# Patient Record
Sex: Female | Born: 1950 | Race: Black or African American | Hispanic: No | Smoking: Former smoker
Health system: Southern US, Community
[De-identification: ages and names within clinical notes are randomized; demographics above are authoritative.]

## PROBLEM LIST (undated history)

## (undated) DIAGNOSIS — E119 Type 2 diabetes mellitus without complications: Secondary | ICD-10-CM

## (undated) DIAGNOSIS — I10 Essential (primary) hypertension: Secondary | ICD-10-CM

## (undated) DIAGNOSIS — K219 Gastro-esophageal reflux disease without esophagitis: Secondary | ICD-10-CM

## (undated) DIAGNOSIS — E785 Hyperlipidemia, unspecified: Secondary | ICD-10-CM

## (undated) DIAGNOSIS — N184 Chronic kidney disease, stage 4 (severe): Secondary | ICD-10-CM

---

## 2016-06-09 DIAGNOSIS — E119 Type 2 diabetes mellitus without complications: Secondary | ICD-10-CM | POA: Diagnosis not present

## 2016-08-18 DIAGNOSIS — N390 Urinary tract infection, site not specified: Secondary | ICD-10-CM | POA: Diagnosis not present

## 2016-08-18 DIAGNOSIS — N183 Chronic kidney disease, stage 3 (moderate): Secondary | ICD-10-CM | POA: Diagnosis not present

## 2016-08-18 DIAGNOSIS — R809 Proteinuria, unspecified: Secondary | ICD-10-CM | POA: Diagnosis not present

## 2016-08-18 DIAGNOSIS — I10 Essential (primary) hypertension: Secondary | ICD-10-CM | POA: Diagnosis not present

## 2016-08-25 ENCOUNTER — Other Ambulatory Visit: Payer: Self-pay | Admitting: Nephrology

## 2016-08-25 DIAGNOSIS — N183 Chronic kidney disease, stage 3 unspecified: Secondary | ICD-10-CM

## 2016-09-01 ENCOUNTER — Ambulatory Visit
Admission: RE | Admit: 2016-09-01 | Discharge: 2016-09-01 | Disposition: A | Discharge status: Home / Self Care | Payer: Medicare HMO | Source: Ambulatory Visit | Attending: Nephrology | Admitting: Nephrology

## 2016-09-01 DIAGNOSIS — I10 Essential (primary) hypertension: Secondary | ICD-10-CM | POA: Diagnosis not present

## 2016-09-01 DIAGNOSIS — N183 Chronic kidney disease, stage 3 unspecified: Secondary | ICD-10-CM

## 2016-09-01 DIAGNOSIS — N390 Urinary tract infection, site not specified: Secondary | ICD-10-CM | POA: Diagnosis not present

## 2016-09-01 DIAGNOSIS — R809 Proteinuria, unspecified: Secondary | ICD-10-CM | POA: Diagnosis not present

## 2016-09-09 DIAGNOSIS — E119 Type 2 diabetes mellitus without complications: Secondary | ICD-10-CM | POA: Diagnosis not present

## 2016-09-12 DIAGNOSIS — I10 Essential (primary) hypertension: Secondary | ICD-10-CM | POA: Diagnosis not present

## 2016-09-12 DIAGNOSIS — R809 Proteinuria, unspecified: Secondary | ICD-10-CM | POA: Diagnosis not present

## 2016-09-12 DIAGNOSIS — N184 Chronic kidney disease, stage 4 (severe): Secondary | ICD-10-CM | POA: Diagnosis not present

## 2016-09-21 DIAGNOSIS — I10 Essential (primary) hypertension: Secondary | ICD-10-CM | POA: Diagnosis not present

## 2016-09-21 DIAGNOSIS — K219 Gastro-esophageal reflux disease without esophagitis: Secondary | ICD-10-CM | POA: Diagnosis not present

## 2016-09-21 DIAGNOSIS — E119 Type 2 diabetes mellitus without complications: Secondary | ICD-10-CM | POA: Diagnosis not present

## 2016-10-18 DIAGNOSIS — K2940 Chronic atrophic gastritis without bleeding: Secondary | ICD-10-CM | POA: Diagnosis not present

## 2016-10-18 DIAGNOSIS — R1013 Epigastric pain: Secondary | ICD-10-CM | POA: Diagnosis not present

## 2016-10-18 DIAGNOSIS — R110 Nausea: Secondary | ICD-10-CM | POA: Diagnosis not present

## 2016-10-18 DIAGNOSIS — R1110 Vomiting, unspecified: Secondary | ICD-10-CM | POA: Diagnosis not present

## 2016-10-18 DIAGNOSIS — K3189 Other diseases of stomach and duodenum: Secondary | ICD-10-CM | POA: Diagnosis not present

## 2016-11-09 DIAGNOSIS — E1122 Type 2 diabetes mellitus with diabetic chronic kidney disease: Secondary | ICD-10-CM | POA: Diagnosis not present

## 2016-11-09 DIAGNOSIS — I10 Essential (primary) hypertension: Secondary | ICD-10-CM | POA: Diagnosis not present

## 2016-11-09 DIAGNOSIS — N184 Chronic kidney disease, stage 4 (severe): Secondary | ICD-10-CM | POA: Diagnosis not present

## 2016-11-09 DIAGNOSIS — E1129 Type 2 diabetes mellitus with other diabetic kidney complication: Secondary | ICD-10-CM | POA: Diagnosis not present

## 2016-11-09 DIAGNOSIS — R809 Proteinuria, unspecified: Secondary | ICD-10-CM | POA: Diagnosis not present

## 2016-11-15 ENCOUNTER — Ambulatory Visit
Admission: RE | Admit: 2016-11-15 | Discharge: 2016-11-15 | Disposition: A | Discharge status: Home / Self Care | Payer: Medicare HMO | Source: Ambulatory Visit | Attending: Nephrology | Admitting: Nephrology

## 2016-11-15 DIAGNOSIS — R809 Proteinuria, unspecified: Secondary | ICD-10-CM | POA: Diagnosis not present

## 2016-11-15 DIAGNOSIS — I129 Hypertensive chronic kidney disease with stage 1 through stage 4 chronic kidney disease, or unspecified chronic kidney disease: Secondary | ICD-10-CM | POA: Diagnosis present

## 2016-11-15 DIAGNOSIS — N184 Chronic kidney disease, stage 4 (severe): Secondary | ICD-10-CM | POA: Diagnosis not present

## 2016-11-15 LAB — BASIC METABOLIC PANEL
ANION GAP: 7 (ref 5–15)
BUN: 62 mg/dL — AB (ref 6–20)
CALCIUM: 8.4 mg/dL — AB (ref 8.9–10.3)
CO2: 22 mmol/L (ref 22–32)
CREATININE: 4.63 mg/dL — AB (ref 0.44–1.00)
Chloride: 109 mmol/L (ref 101–111)
GFR calc Af Amer: 11 mL/min — ABNORMAL LOW (ref >60)
GFR, EST NON AFRICAN AMERICAN: 9 mL/min — AB (ref >60)
GLUCOSE: 238 mg/dL — AB (ref 65–99)
Potassium: 5.9 mmol/L — ABNORMAL HIGH (ref 3.5–5.1)
Sodium: 138 mmol/L (ref 135–145)

## 2016-11-15 MED ORDER — SODIUM CHLORIDE 0.9 % IV SOLN
INTRAVENOUS | Status: DC
  Administered 2016-11-15: 12:00:00 via INTRAVENOUS

## 2016-11-15 NOTE — Progress Notes (Signed)
Dr Thedore MinsSingh beeped regarding lab results

## 2016-11-15 NOTE — Progress Notes (Signed)
Pt left unit in stable condition.

## 2016-11-15 NOTE — Progress Notes (Signed)
Lab reviewed by Dr Wynelle Linkkolluru- pt may be discharged to home -office will follow up with patient

## 2016-11-15 NOTE — Progress Notes (Signed)
Dr Thedore MinsSingh beeped again re: lab results

## 2016-11-15 NOTE — Progress Notes (Signed)
Abnormal Met B results faxed to offfice to [facility]. Mandy notified of abnormal results

## 2016-11-21 ENCOUNTER — Inpatient Hospital Stay
Admission: AD | Admit: 2016-11-21 | Discharge: 2016-12-01 | DRG: 674 | Disposition: A | Discharge status: Home / Self Care | Payer: Medicare HMO | Source: Ambulatory Visit | Attending: Internal Medicine | Admitting: Internal Medicine

## 2016-11-21 ENCOUNTER — Encounter: Payer: Self-pay | Admitting: *Deleted

## 2016-11-21 DIAGNOSIS — E875 Hyperkalemia: Secondary | ICD-10-CM | POA: Diagnosis present

## 2016-11-21 DIAGNOSIS — K219 Gastro-esophageal reflux disease without esophagitis: Secondary | ICD-10-CM | POA: Diagnosis present

## 2016-11-21 DIAGNOSIS — R110 Nausea: Secondary | ICD-10-CM | POA: Diagnosis not present

## 2016-11-21 DIAGNOSIS — E440 Moderate protein-calorie malnutrition: Secondary | ICD-10-CM | POA: Diagnosis present

## 2016-11-21 DIAGNOSIS — N185 Chronic kidney disease, stage 5: Secondary | ICD-10-CM | POA: Diagnosis not present

## 2016-11-21 DIAGNOSIS — Z886 Allergy status to analgesic agent status: Secondary | ICD-10-CM

## 2016-11-21 DIAGNOSIS — J449 Chronic obstructive pulmonary disease, unspecified: Secondary | ICD-10-CM | POA: Diagnosis present

## 2016-11-21 DIAGNOSIS — E785 Hyperlipidemia, unspecified: Secondary | ICD-10-CM | POA: Diagnosis not present

## 2016-11-21 DIAGNOSIS — E1122 Type 2 diabetes mellitus with diabetic chronic kidney disease: Secondary | ICD-10-CM | POA: Diagnosis present

## 2016-11-21 DIAGNOSIS — N186 End stage renal disease: Secondary | ICD-10-CM

## 2016-11-21 DIAGNOSIS — N2581 Secondary hyperparathyroidism of renal origin: Secondary | ICD-10-CM | POA: Diagnosis not present

## 2016-11-21 DIAGNOSIS — Z87891 Personal history of nicotine dependence: Secondary | ICD-10-CM | POA: Diagnosis not present

## 2016-11-21 DIAGNOSIS — I1311 Hypertensive heart and chronic kidney disease without heart failure, with stage 5 chronic kidney disease, or end stage renal disease: Secondary | ICD-10-CM | POA: Diagnosis present

## 2016-11-21 DIAGNOSIS — Z833 Family history of diabetes mellitus: Secondary | ICD-10-CM

## 2016-11-21 DIAGNOSIS — D631 Anemia in chronic kidney disease: Secondary | ICD-10-CM | POA: Diagnosis not present

## 2016-11-21 DIAGNOSIS — R112 Nausea with vomiting, unspecified: Secondary | ICD-10-CM | POA: Diagnosis not present

## 2016-11-21 DIAGNOSIS — N179 Acute kidney failure, unspecified: Secondary | ICD-10-CM | POA: Diagnosis not present

## 2016-11-21 DIAGNOSIS — E119 Type 2 diabetes mellitus without complications: Secondary | ICD-10-CM | POA: Diagnosis not present

## 2016-11-21 DIAGNOSIS — E1121 Type 2 diabetes mellitus with diabetic nephropathy: Secondary | ICD-10-CM | POA: Diagnosis present

## 2016-11-21 DIAGNOSIS — N049 Nephrotic syndrome with unspecified morphologic changes: Secondary | ICD-10-CM | POA: Diagnosis present

## 2016-11-21 DIAGNOSIS — N189 Chronic kidney disease, unspecified: Secondary | ICD-10-CM | POA: Diagnosis not present

## 2016-11-21 DIAGNOSIS — Z681 Body mass index (BMI) 19 or less, adult: Secondary | ICD-10-CM

## 2016-11-21 DIAGNOSIS — R809 Proteinuria, unspecified: Secondary | ICD-10-CM | POA: Diagnosis not present

## 2016-11-21 DIAGNOSIS — Z01818 Encounter for other preprocedural examination: Secondary | ICD-10-CM | POA: Diagnosis not present

## 2016-11-21 DIAGNOSIS — I10 Essential (primary) hypertension: Secondary | ICD-10-CM | POA: Diagnosis not present

## 2016-11-21 HISTORY — DX: Hyperlipidemia, unspecified: E78.5

## 2016-11-21 HISTORY — DX: Type 2 diabetes mellitus without complications: E11.9

## 2016-11-21 HISTORY — DX: Essential (primary) hypertension: I10

## 2016-11-21 HISTORY — DX: Gastro-esophageal reflux disease without esophagitis: K21.9

## 2016-11-21 HISTORY — DX: Chronic kidney disease, stage 4 (severe): N18.4

## 2016-11-21 LAB — GLUCOSE, CAPILLARY
GLUCOSE-CAPILLARY: 108 mg/dL — AB (ref 65–99)
Glucose-Capillary: 87 mg/dL (ref 65–99)

## 2016-11-21 MED ORDER — ATORVASTATIN CALCIUM 10 MG PO TABS
10.0000 mg | ORAL_TABLET | Freq: Every day | ORAL | Status: DC
  Administered 2016-11-22 – 2016-12-01 (×9): 10 mg via ORAL
  Filled 2016-11-21 (×10): qty 1

## 2016-11-21 MED ORDER — CEFAZOLIN SODIUM-DEXTROSE 1-4 GM/50ML-% IV SOLN
1.0000 g | INTRAVENOUS | Status: AC
  Administered 2016-11-22: 1 g via INTRAVENOUS
  Filled 2016-11-21 (×2): qty 50

## 2016-11-21 MED ORDER — NIFEDIPINE ER 30 MG PO TB24
30.0000 mg | ORAL_TABLET | Freq: Every day | ORAL | Status: DC
  Administered 2016-11-22 – 2016-11-23 (×2): 30 mg via ORAL
  Filled 2016-11-21 (×4): qty 1

## 2016-11-21 MED ORDER — ONDANSETRON HCL 4 MG/2ML IJ SOLN
4.0000 mg | Freq: Four times a day (QID) | INTRAMUSCULAR | Status: DC | PRN
  Filled 2016-11-21: qty 2

## 2016-11-21 MED ORDER — TUBERCULIN PPD 5 UNIT/0.1ML ID SOLN
5.0000 [IU] | Freq: Once | INTRADERMAL | Status: AC
  Administered 2016-11-21: 5 [IU] via INTRADERMAL
  Filled 2016-11-21: qty 0.1

## 2016-11-21 MED ORDER — SODIUM CHLORIDE 0.9 % IV SOLN
INTRAVENOUS | Status: DC
  Administered 2016-11-21 – 2016-11-22 (×3): via INTRAVENOUS

## 2016-11-21 MED ORDER — METOPROLOL TARTRATE 50 MG PO TABS
50.0000 mg | ORAL_TABLET | Freq: Two times a day (BID) | ORAL | Status: DC
  Administered 2016-11-21 – 2016-11-22 (×3): 50 mg via ORAL
  Filled 2016-11-21 (×3): qty 1

## 2016-11-21 MED ORDER — ACETAMINOPHEN 325 MG PO TABS
650.0000 mg | ORAL_TABLET | Freq: Four times a day (QID) | ORAL | Status: DC | PRN
  Administered 2016-11-23 – 2016-11-25 (×2): 650 mg via ORAL
  Filled 2016-11-21: qty 2

## 2016-11-21 MED ORDER — INSULIN ASPART 100 UNIT/ML ~~LOC~~ SOLN
0.0000 [IU] | Freq: Three times a day (TID) | SUBCUTANEOUS | Status: DC
  Administered 2016-11-23: 5 [IU] via SUBCUTANEOUS
  Administered 2016-11-24: 3 [IU] via SUBCUTANEOUS
  Administered 2016-11-24: 2 [IU] via SUBCUTANEOUS
  Administered 2016-11-24: 5 [IU] via SUBCUTANEOUS
  Administered 2016-11-25: 2 [IU] via SUBCUTANEOUS
  Administered 2016-11-25: 5 [IU] via SUBCUTANEOUS
  Administered 2016-11-26: 2 [IU] via SUBCUTANEOUS
  Administered 2016-11-26: 5 [IU] via SUBCUTANEOUS
  Administered 2016-11-26 – 2016-11-27 (×4): 2 [IU] via SUBCUTANEOUS
  Administered 2016-11-28: 3 [IU] via SUBCUTANEOUS
  Administered 2016-11-28: 1 [IU] via SUBCUTANEOUS
  Administered 2016-11-29: 3 [IU] via SUBCUTANEOUS
  Administered 2016-11-29 (×2): 2 [IU] via SUBCUTANEOUS
  Administered 2016-11-30: 3 [IU] via SUBCUTANEOUS
  Administered 2016-11-30: 1 [IU] via SUBCUTANEOUS
  Filled 2016-11-21 (×19): qty 1

## 2016-11-21 MED ORDER — PANTOPRAZOLE SODIUM 40 MG PO TBEC
40.0000 mg | DELAYED_RELEASE_TABLET | Freq: Two times a day (BID) | ORAL | Status: DC
  Administered 2016-11-21 – 2016-12-01 (×19): 40 mg via ORAL
  Filled 2016-11-21 (×19): qty 1

## 2016-11-21 MED ORDER — INSULIN ASPART 100 UNIT/ML ~~LOC~~ SOLN
0.0000 [IU] | Freq: Every day | SUBCUTANEOUS | Status: DC
Start: 2016-11-21 — End: 2016-12-01
  Administered 2016-11-25: 4 [IU] via SUBCUTANEOUS
  Administered 2016-11-26 – 2016-11-30 (×2): 2 [IU] via SUBCUTANEOUS
  Filled 2016-11-21 (×3): qty 1

## 2016-11-21 MED ORDER — ACETAMINOPHEN 650 MG RE SUPP: 650.0000 mg | Freq: Four times a day (QID) | RECTAL | Status: DC | PRN

## 2016-11-21 MED ORDER — HEPARIN SODIUM (PORCINE) 5000 UNIT/ML IJ SOLN
5000.0000 [IU] | Freq: Three times a day (TID) | INTRAMUSCULAR | Status: DC
  Administered 2016-11-21 – 2016-11-30 (×23): 5000 [IU] via SUBCUTANEOUS
  Filled 2016-11-21 (×24): qty 1

## 2016-11-21 MED ORDER — ONDANSETRON HCL 4 MG PO TABS
4.0000 mg | ORAL_TABLET | Freq: Four times a day (QID) | ORAL | Status: DC | PRN
  Filled 2016-11-21: qty 1

## 2016-11-21 NOTE — H&P (Signed)
[employer] - [place] at [facility]   PATIENT NAME: Brittany Bowman    MR#:  [id number]  DATE OF BIRTH:  1950/05/07  DATE OF ADMISSION:  11/21/2016  PRIMARY CARE PHYSICIAN: Velia Meyer, MD   REQUESTING/REFERRING PHYSICIAN: Dr. Mosetta Pigeon  CHIEF COMPLAINT:  No chief complaint on file. Acute on Chronic Renal failure.   HISTORY OF PRESENT ILLNESS:  Brittany Bowman  is a 66 y.o. female with a known history of diabetes, hypertension, GERD, hyperlipidemia, chronic kidney disease stage IV who presents to the hospital due to worsening renal failure and function. Patient was in her usual state of health and was having routine blood work done at his outpatient nephrologist office and her creatinine was noted to be significantly worse from before and therefore was sent to the ER for direct admission. Nephrology was planning on doing a kidney biopsy as an outpatient but her renal function has now gotten worse she may require hemodialysis and therefore was referred to the hospital for direct admission.Patient denies any shortness of breath, chest pain, nausea, vomiting, abdominal pain, fever or chills cough or any other associated symptoms presently.  PAST MEDICAL HISTORY:   Past Medical History:  Diagnosis Date  . CKD (chronic kidney disease), stage IV (HCC)   . Diabetes (HCC)   . Essential hypertension   . GERD (gastroesophageal reflux disease)   . Hyperlipidemia     PAST SURGICAL HISTORY:  No past surgical history on file.  SOCIAL HISTORY:   Social History  Substance Use Topics  . Smoking status: Former Smoker    Packs/day: 0.25    Years: 30.00  . Smokeless tobacco: Never Used  . Alcohol use No    FAMILY HISTORY:   Family History  Problem Relation Age of Onset  . Diabetes Mother     DRUG ALLERGIES:   Allergies  Allergen Reactions  . Aspirin   . Nsaids     REVIEW OF SYSTEMS:   Review of Systems  Constitutional: Negative for fever and  weight loss.  HENT: Negative for congestion, nosebleeds and tinnitus.   Eyes: Negative for blurred vision, double vision and redness.  Respiratory: Negative for cough, hemoptysis and shortness of breath.   Cardiovascular: Negative for chest pain, orthopnea, leg swelling and PND.  Gastrointestinal: Negative for abdominal pain, diarrhea, melena, nausea and vomiting.  Genitourinary: Negative for dysuria, hematuria and urgency.  Musculoskeletal: Negative for falls and joint pain.  Neurological: Negative for dizziness, tingling, sensory change, focal weakness, seizures, weakness and headaches.  Endo/Heme/Allergies: Negative for polydipsia. Does not bruise/bleed easily.  Psychiatric/Behavioral: Negative for depression and memory loss. The patient is not nervous/anxious.     MEDICATIONS AT HOME:   Prior to Admission medications   Medication Sig Start Date End Date Taking? Authorizing Provider  atorvastatin (LIPITOR) 10 MG tablet Take 10 mg by mouth daily.   Yes [provider]  metoprolol tartrate (LOPRESSOR) 50 MG tablet Take 50 mg by mouth 2 (two) times daily.   Yes [provider]  NIFEdipine (PROCARDIA-XL/ADALAT CC) 30 MG 24 hr tablet Take 30 mg by mouth daily.   Yes [provider]  pantoprazole (PROTONIX) 40 MG tablet Take 40 mg by mouth 2 (two) times daily before a meal.   Yes [provider]      VITAL SIGNS:  Blood pressure (!) 150/61, pulse 73, temperature 98.2 F (36.8 C), temperature source Oral, resp. rate 12, SpO2 97 %.  PHYSICAL EXAMINATION:  Physical Exam  GENERAL:  66 y.o.-year-old patient lying in the bed in no acute distress.  EYES: Pupils equal, round, reactive to light and accommodation. No scleral icterus. Extraocular muscles intact.  HEENT: Head atraumatic, normocephalic. Oropharynx and nasopharynx clear. No oropharyngeal erythema, moist oral mucosa  NECK:  Supple, no jugular venous distention. No thyroid enlargement, no  tenderness.  LUNGS: Normal breath sounds bilaterally, no wheezing, rales, rhonchi. No use of accessory muscles of respiration.  CARDIOVASCULAR: S1, S2 RRR. No murmurs, rubs, gallops, clicks.  ABDOMEN: Soft, nontender, nondistended. Bowel sounds present. No organomegaly or mass.  EXTREMITIES: No pedal edema, cyanosis, or clubbing. + 2 pedal & radial pulses b/l.   NEUROLOGIC: Cranial nerves II through XII are intact. No focal Motor or sensory deficits appreciated b/l PSYCHIATRIC: The patient is alert and oriented x 3.  SKIN: No obvious rash, lesion, or ulcer.   LABORATORY PANEL:   CBC No results for input(s): WBC, HGB, HCT, PLT in the last 168 hours. ------------------------------------------------------------------------------------------------------------------  Chemistries   Recent Labs Lab 11/15/16 1349  NA 138  K 5.9*  CL 109  CO2 22  GLUCOSE 238*  BUN 62*  CREATININE 4.63*  CALCIUM 8.4*   ------------------------------------------------------------------------------------------------------------------  Cardiac Enzymes No results for input(s): TROPONINI in the last 168 hours. ------------------------------------------------------------------------------------------------------------------  RADIOLOGY:  No results found.   IMPRESSION AND PLAN:   66 year old female with past medical history of diabetes, hypertension, hyperlipidemia,chronic kidney disease stage III to 4 who presented to the hospital due to worsening renal failure.  1. Acute on chronic renal failure-patient presents to the hospital due to her creatinine progressively worsening over the past couple months. - We'll gently hydrate the patient with IV fluids, follow BUN and creatinine and urine output. Renal dose minutes, avoid nephrotoxins. - We'll get a nephrology consult, patient may need to be initiated on hemodialysis and therefore we'll also consult vascular surgery for dialysis access. - Further care as  per nephrology.  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 Essential HTN - cont. Home meds.   4. Hyperlipidemia - cont. Atorvastatin  5. GERD - cont. Protonix.     All the records are reviewed and case discussed with ED provider. Management plans discussed with the patient, family and they are in agreement.  CODE STATUS: Full code  TOTAL TIME TAKING CARE OF THIS PATIENT: 45 minutes.    Houston Siren M.D on 11/21/2016 at 3:00 PM  Between 7am to 6pm - Pager - [phone]  After 6pm go to [email or web address] - password [email or web address] [facility]  [place] [facility]  Office  [phone]  CC: Primary care physician; Velia Meyer, MD

## 2016-11-21 NOTE — Consult Note (Signed)
[facility] Vascular Consult Note  MRN : [id number]  Brittany Bowman is a 66 y.o. ([birth year]) female who presents with chief complaint of No chief complaint on file. Brittany Bowman  History of Present Illness: I am asked to evaluate the patient by Dr. Cherlynn Kaiser for dialysis access.  The patient is a 66 year old woman who is been followed by Dr. Thedore Mins for proteinuria and stage IV renal insufficiency. She was sent to the hospital after her routine laboratory data demonstrated a significant worsening of her kidney function. On evaluation by Dr. Thedore Mins he does feel that she will require initiation of hemodialysis.  The patient does attest to nausea and some vomiting as well as vague abdominal discomfort. She also notes frequent belching. She attributes this to a chronic stomach problems and that is not yet defined although she does follow with a restaurant neurologist. She states that it may be a bit worse lately. She also attests to fatigue.  She denies shortness of breath cough and hemoptysis or severe leg swelling.  Current Facility-Administered Medications  Medication Dose Route Frequency Provider Last Rate Last Dose  . 0.9 %  sodium chloride infusion   Intravenous Continuous Houston Siren, MD 75 mL/hr at 11/21/16 1606    . acetaminophen (TYLENOL) tablet 650 mg  650 mg Oral Q6H PRN Houston Siren, MD       Or  . acetaminophen (TYLENOL) suppository 650 mg  650 mg Rectal Q6H PRN Houston Siren, MD      . Melene Muller ON 11/22/2016] atorvastatin (LIPITOR) tablet 10 mg  10 mg Oral Daily Sainani, Rolly Pancake, MD      . heparin injection 5,000 Units  5,000 Units Subcutaneous Q8H Sainani, Rolly Pancake, MD      . insulin aspart (novoLOG) injection 0-5 Units  0-5 Units Subcutaneous QHS Sainani, Rolly Pancake, MD      . insulin aspart (novoLOG) injection 0-9 Units  0-9 Units Subcutaneous TID WC Sainani, Rolly Pancake, MD      . metoprolol tartrate (LOPRESSOR) tablet 50 mg  50 mg Oral BID Houston Siren, MD       . Melene Muller ON 11/22/2016] NIFEdipine (PROCARDIA-XL/ADALAT CC) 24 hr tablet 30 mg  30 mg Oral Daily Sainani, Vivek J, MD      . ondansetron (ZOFRAN) tablet 4 mg  4 mg Oral Q6H PRN Houston Siren, MD       Or  . ondansetron (ZOFRAN) injection 4 mg  4 mg Intravenous Q6H PRN Houston Siren, MD      . pantoprazole (PROTONIX) EC tablet 40 mg  40 mg Oral BID AC Houston Siren, MD   40 mg at 11/21/16 1708  . tuberculin injection 5 Units  5 Units Intradermal Once Mosetta Pigeon, MD   5 Units at 11/21/16 1709    Past Medical History:  Diagnosis Date  . CKD (chronic kidney disease), stage IV (HCC)   . Diabetes (HCC)   . Essential hypertension   . GERD (gastroesophageal reflux disease)   . Hyperlipidemia     History reviewed. No pertinent surgical history.  Social History Social History  Substance Use Topics  . Smoking status: Former Smoker    Packs/day: 0.25    Years: 30.00  . Smokeless tobacco: Never Used  . Alcohol use No    Family History Family History  Problem Relation Age of Onset  . Diabetes Mother   No family history of bleeding/clotting disorders, porphyria or autoimmune disease   Allergies  Allergen Reactions  . Aspirin   . Nsaids      REVIEW OF SYSTEMS (Negative unless checked)  Constitutional: [] Weight loss  [] Fever  [] Chills Cardiac: [] Chest pain   [] Chest pressure   [] Palpitations   [] Shortness of breath when laying flat   [] Shortness of breath at rest   [] Shortness of breath with exertion. Vascular:  [] Pain in legs with walking   [] Pain in legs at rest   [] Pain in legs when laying flat   [] Claudication   [] Pain in feet when walking  [] Pain in feet at rest  [] Pain in feet when laying flat   [] History of DVT   [] Phlebitis   [] Swelling in legs   [] Varicose veins   [] Non-healing ulcers Pulmonary:   [] Uses home oxygen   [] Productive cough   [] Hemoptysis   [] Wheeze  [] COPD   [] Asthma Neurologic:  [] Dizziness  [] Blackouts   [] Seizures   [] History of stroke    [] History of TIA  [] Aphasia   [] Temporary blindness   [] Dysphagia   [] Weakness or numbness in arms   [] Weakness or numbness in legs Musculoskeletal:  [] Arthritis   [] Joint swelling   [] Joint pain   [] Low back pain Hematologic:  [] Easy bruising  [] Easy bleeding   [] Hypercoagulable state   [x] Anemic  [] Hepatitis Gastrointestinal:  [] Blood in stool   [] Vomiting blood  [] Gastroesophageal reflux/heartburn   [] Difficulty swallowing. Genitourinary:  [x] Chronic kidney disease   [] Difficult urination  [] Frequent urination  [] Burning with urination   [] Blood in urine Skin:  [] Rashes   [] Ulcers   [] Wounds Psychological:  [] History of anxiety   []  History of major depression.  Physical Examination  Vitals:   11/21/16 1447  BP: (!) 150/61  Pulse: 73  Resp: 12  Temp: 98.2 F (36.8 C)  TempSrc: Oral  SpO2: 97%  Weight: 54 kg (119 lb 1.6 oz)  Height: 5\' 8"  (1.727 m)   Body mass index is 18.11 kg/m. Gen:  WD/WN, NAD Head: [place]/AT, No temporalis wasting. Prominent temp pulse not noted. Ear/Nose/Throat: Hearing grossly intact, nares w/o erythema or drainage, oropharynx w/o Erythema/Exudate Eyes: Sclera non-icteric, conjunctiva clear Neck: Trachea midline.  No JVD.  Pulmonary:  Good air movement, respirations not labored, equal bilaterally.  Cardiac: RRR, normal S1, S2. Vascular: Legs with 1-2+ edema Vessel Right Left  Radial Palpable Palpable  Ulnar Palpable Palpable  Brachial Palpable Palpable  Carotid Palpable, without bruit Palpable, without bruit  PT Palpable Palpable  DP Palpable Palpable  Gastrointestinal: soft, non-tender/non-distended. No guarding/reflex.  Musculoskeletal: M/S 5/5 throughout.  Extremities without ischemic changes.  No deformity or atrophy. No edema. Neurologic: Sensation grossly intact in extremities.  Symmetrical.  Speech is fluent. Motor exam as listed above. Psychiatric: Judgment intact, Mood & affect appropriate for pt's clinical situation. Dermatologic: No rashes  or ulcers noted.  No cellulitis or open wounds. Lymph : No Cervical, Axillary, or Inguinal lymphadenopathy.      CBC No results found for: WBC, HGB, HCT, MCV, PLT  BMET    Component Value Date/Time   NA 138 11/15/2016 1349   K 5.9 (H) 11/15/2016 1349   CL 109 11/15/2016 1349   CO2 22 11/15/2016 1349   GLUCOSE 238 (H) 11/15/2016 1349   BUN 62 (H) 11/15/2016 1349   CREATININE 4.63 (H) 11/15/2016 1349   CALCIUM 8.4 (L) 11/15/2016 1349   GFRNONAA 9 (L) 11/15/2016 1349   GFRAA 11 (L) 11/15/2016 1349   Estimated Creatinine Clearance: 10.3 mL/min (A) (by C-G formula based on SCr of 4.63  mg/dL (H)).  COAG No results found for: INR, PROTIME  Radiology No results found.    Assessment/Plan 1. Acute on chronic renal failure-patient presents to the hospital due to her creatinine progressively worsening over the past couple months. She is currently being hydrated with normal saline. At this point Dr. Thedore Mins feels that dialysis should be initiated however she is not an extremist she is not short of breath and therefore rather than place a temporary catheter I will plan for a permacatheter to be placed tomorrow. The risks and benefits of been reviewed all questions answered the patient agrees to proceed.  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 I will make her nothing by mouth after breakfast tomorrow  3. Essential HTN          Continue antihypertensive home meds.   4. Hyperlipidemia           Continue her statin  5. GERD            Continue her PPI    Levora Dredge, MD  11/21/2016 7:59 PM    This note was created with Dragon medical transcription system.  Any error is purely unintentional

## 2016-11-21 NOTE — Progress Notes (Signed)
[facility] [place], [place] 11/21/16  Subjective:   Patient known to our practice from outpatient. She is followed for proteinuria, hypertension and chronic kidney disease Over the past several months, she has had a progressive decline in her renal function. Serologies have been negative. Outpatient, she is having difficulty with hyperkalemia She is admitted to get started on dialysis  Objective:  Vital signs in last 24 hours:  Temp:  [98.2 F (36.8 C)] 98.2 F (36.8 C) (08/21 1447) Pulse Rate:  [73] 73 (08/21 1447) Resp:  [12] 12 (08/21 1447) BP: (150)/(61) 150/61 (08/21 1447) SpO2:  [97 %] 97 % (08/21 1447) Weight:  [54 kg (119 lb 1.6 oz)] 54 kg (119 lb 1.6 oz) (08/21 1447)  Weight change:  Filed Weights   11/21/16 1447  Weight: 54 kg (119 lb 1.6 oz)    Intake/Output:   No intake or output data in the 24 hours ending 11/21/16 1554   Physical Exam: General: Thin, no acute distress   HEENT Anicteric, moist oral mucous membranes   Neck Supple   Pulm/lungs Lungs are clear to auscultation   CVS/Heart Regular rhythm, no rub or gallop   Abdomen:  Soft, nontender   Extremities: No edema   Neurologic: Alert, oriented   Skin: No acute rashes   Access: To be placed        Basic Metabolic Panel:   Recent Labs Lab 11/15/16 1349  NA 138  K 5.9*  CL 109  CO2 22  GLUCOSE 238*  BUN 62*  CREATININE 4.63*  CALCIUM 8.4*     CBC: No results for input(s): WBC, NEUTROABS, HGB, HCT, MCV, PLT in the last 168 hours.   No results found for: HEPBSAG, HEPBSAB, HEPBIGM    Microbiology:  No results found for this or any previous visit (from the past 240 hour(s)).  Coagulation Studies: No results for input(s): LABPROT, INR in the last 72 hours.  Urinalysis: No results for input(s): COLORURINE, LABSPEC, PHURINE, GLUCOSEU, HGBUR, BILIRUBINUR, KETONESUR, PROTEINUR, UROBILINOGEN, NITRITE, LEUKOCYTESUR in the last 72 hours.  Invalid input(s):  APPERANCEUR    Imaging: No results found.   Medications:   . sodium chloride     . heparin  5,000 Units Subcutaneous Q8H  . insulin aspart  0-5 Units Subcutaneous QHS  . insulin aspart  0-9 Units Subcutaneous TID WC   acetaminophen **OR** acetaminophen, ondansetron **OR** ondansetron (ZOFRAN) IV  Assessment/ Plan:  66 y.o. female with long-standing diabetes, left ventricular hypertrophy, severe hypertension and chronic kidney disease. She was Dx with left ICA stenosis in October 2017. A 2-D echo at that time showed severe LVH and stage I diastolic dysfunction. LV EF was 65-70%. Her hemoglobin A1c was 6.1%.  1. Chronic kidney disease stage V. Patient has likely progressed to end-stage renal disease.  Chronic kidney disease is likely secondary to nephrotic syndrome. Differential includes diabetic kidney disease, FSGS, membranous nephropathy. We discussed that due to advanced renal failure and echogenic kidneys, there is a higher risk of bleeding. Patient has decided not to go for kidney biopsy. We discussed various forms of renal replacement therapy including peritoneal dialysis and hemodialysis. At present, patient prefers hemodialysis therefore we will ask vascular surgery to place a PermCath. We will obtain vein mapping for future access placement. We will start discharge planning for outpatient dialysis center.   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 Anemia of chronic kidney disease  - We will  obtain iron studies and get patient started on ESA therapy as indicated   4. Proteinuria - We will be able to start ARB once potassium is controlled.   5. Hyperkalemia - Dietary education - Expected to correct with hemodialysis   LOS: 0 [facility] 8/21/20183:54 PM  [facility] [place], [place] [phone]

## 2016-11-22 ENCOUNTER — Inpatient Hospital Stay
Admission: AD | Disposition: A | Discharge status: Home / Self Care | Payer: Self-pay | Source: Ambulatory Visit | Attending: Specialist

## 2016-11-22 DIAGNOSIS — N186 End stage renal disease: Secondary | ICD-10-CM

## 2016-11-22 HISTORY — PX: DIALYSIS/PERMA CATHETER INSERTION: CATH118288

## 2016-11-22 LAB — BASIC METABOLIC PANEL
Anion gap: 7 (ref 5–15)
BUN: 67 mg/dL — ABNORMAL HIGH (ref 6–20)
CHLORIDE: 111 mmol/L (ref 101–111)
CO2: 22 mmol/L (ref 22–32)
CREATININE: 4.32 mg/dL — AB (ref 0.44–1.00)
Calcium: 8.3 mg/dL — ABNORMAL LOW (ref 8.9–10.3)
GFR calc non Af Amer: 10 mL/min — ABNORMAL LOW (ref >60)
GFR, EST AFRICAN AMERICAN: 11 mL/min — AB (ref >60)
Glucose, Bld: 104 mg/dL — ABNORMAL HIGH (ref 65–99)
Potassium: 4.9 mmol/L (ref 3.5–5.1)
Sodium: 140 mmol/L (ref 135–145)

## 2016-11-22 LAB — CBC
HCT: 23.8 % — ABNORMAL LOW (ref 35.0–47.0)
Hemoglobin: 8.1 g/dL — ABNORMAL LOW (ref 12.0–16.0)
MCH: 31.6 pg (ref 26.0–34.0)
MCHC: 34.2 g/dL (ref 32.0–36.0)
MCV: 92.3 fL (ref 80.0–100.0)
PLATELETS: 294 10*3/uL (ref 150–440)
RBC: 2.58 MIL/uL — AB (ref 3.80–5.20)
RDW: 12.2 % (ref 11.5–14.5)
WBC: 7.6 10*3/uL (ref 3.6–11.0)

## 2016-11-22 LAB — GLUCOSE, CAPILLARY
GLUCOSE-CAPILLARY: 53 mg/dL — AB (ref 65–99)
GLUCOSE-CAPILLARY: 98 mg/dL (ref 65–99)
Glucose-Capillary: 85 mg/dL (ref 65–99)
Glucose-Capillary: 153 mg/dL — ABNORMAL HIGH (ref 65–99)
Glucose-Capillary: 109 mg/dL — ABNORMAL HIGH (ref 65–99)

## 2016-11-22 LAB — HIV ANTIBODY (ROUTINE TESTING W REFLEX): HIV SCREEN 4TH GENERATION: NONREACTIVE

## 2016-11-22 SURGERY — DIALYSIS/PERMA CATHETER INSERTION
Anesthesia: Moderate Sedation

## 2016-11-22 MED ORDER — LIDOCAINE-EPINEPHRINE (PF) 2 %-1:200000 IJ SOLN
INTRAMUSCULAR | Status: AC
  Filled 2016-11-22: qty 20

## 2016-11-22 MED ORDER — DEXTROSE 50 % IV SOLN
25.0000 mL | Freq: Once | INTRAVENOUS | Status: AC
  Administered 2016-11-22: 25 mL via INTRAVENOUS
  Filled 2016-11-22: qty 50

## 2016-11-22 MED ORDER — NEPRO/CARBSTEADY PO LIQD
237.0000 mL | Freq: Two times a day (BID) | ORAL | Status: DC
  Administered 2016-11-23 – 2016-11-30 (×13): 237 mL via ORAL

## 2016-11-22 MED ORDER — FENTANYL CITRATE (PF) 100 MCG/2ML IJ SOLN
INTRAMUSCULAR | Status: DC | PRN
  Administered 2016-11-22: 25 ug via INTRAVENOUS
  Administered 2016-11-22: 50 ug via INTRAVENOUS

## 2016-11-22 MED ORDER — HEPARIN (PORCINE) IN NACL 2-0.9 UNIT/ML-% IJ SOLN
INTRAMUSCULAR | Status: AC
  Filled 2016-11-22: qty 500

## 2016-11-22 MED ORDER — MIDAZOLAM HCL 5 MG/5ML IJ SOLN
INTRAMUSCULAR | Status: AC
  Filled 2016-11-22: qty 5

## 2016-11-22 MED ORDER — HEPARIN SODIUM (PORCINE) 10000 UNIT/ML IJ SOLN
INTRAMUSCULAR | Status: AC
  Filled 2016-11-22: qty 1

## 2016-11-22 MED ORDER — RENA-VITE PO TABS
1.0000 | ORAL_TABLET | Freq: Every day | ORAL | Status: DC
  Administered 2016-11-24 – 2016-11-30 (×7): 1 via ORAL
  Filled 2016-11-22 (×8): qty 1

## 2016-11-22 MED ORDER — HYDRALAZINE HCL 20 MG/ML IJ SOLN
10.0000 mg | INTRAMUSCULAR | Status: DC | PRN
  Administered 2016-11-22 – 2016-11-23 (×2): 10 mg via INTRAVENOUS
  Filled 2016-11-22 (×2): qty 1

## 2016-11-22 MED ORDER — IRBESARTAN 150 MG PO TABS
150.0000 mg | ORAL_TABLET | Freq: Every day | ORAL | Status: DC
  Administered 2016-11-22: 150 mg via ORAL
  Filled 2016-11-22: qty 1

## 2016-11-22 MED ORDER — CLONIDINE HCL 0.3 MG/24HR TD PTWK
0.3000 mg | MEDICATED_PATCH | TRANSDERMAL | Status: DC
  Administered 2016-11-22: 0.3 mg via TRANSDERMAL
  Filled 2016-11-22: qty 1

## 2016-11-22 MED ORDER — FENTANYL CITRATE (PF) 100 MCG/2ML IJ SOLN
INTRAMUSCULAR | Status: AC
  Filled 2016-11-22: qty 2

## 2016-11-22 MED ORDER — MIDAZOLAM HCL 2 MG/2ML IJ SOLN
INTRAMUSCULAR | Status: DC | PRN
  Administered 2016-11-22: 2 mg via INTRAVENOUS
  Administered 2016-11-22: 0.5 mg via INTRAVENOUS

## 2016-11-22 SURGICAL SUPPLY — 9 items
BIOPATCH RED 1 DISK 7.0 (GAUZE/BANDAGES/DRESSINGS) ×2 IMPLANT
CATH PALINDROME RT-P 15FX19CM (CATHETERS) ×2 IMPLANT
DRAPE INCISE IOBAN 66X45 STRL (DRAPES) ×2 IMPLANT
NEEDLE ENTRY 21GA 7CM ECHOTIP (NEEDLE) ×2 IMPLANT
PACK ANGIOGRAPHY (CUSTOM PROCEDURE TRAY) ×2 IMPLANT
SET INTRO CAPELLA COAXIAL (SET/KITS/TRAYS/PACK) ×2 IMPLANT
SUT MNCRL AB 4-0 PS2 18 (SUTURE) ×2 IMPLANT
SUT SILK 0 FSL (SUTURE) ×2 IMPLANT
TOWEL OR 17X26 4PK STRL BLUE (TOWEL DISPOSABLE) ×2 IMPLANT

## 2016-11-22 NOTE — Progress Notes (Signed)
Patient in recovery post procedure, vitals stable. Denies complaints, taking po's without difficulty. Called report to [facility] with plan reviewed. Sinus rhythm per monitor.

## 2016-11-22 NOTE — Progress Notes (Signed)
[facility] [place], [place] 11/22/16  Subjective:   Patient known to our practice from outpatient. She is followed for proteinuria, hypertension and chronic kidney disease Over the past several months, she has had a progressive decline in her renal function. Serologies have been negative. Feels well today Scheduled for permcath today   Objective:  Vital signs in last 24 hours:  Temp:  [98 F (36.7 C)-98.7 F (37.1 C)] 98.7 F (37.1 C) (08/22 0746) Pulse Rate:  [73-90] 90 (08/22 0746) Resp:  [12-20] 18 (08/22 0448) BP: (150-216)/(61-80) 179/76 (08/22 0746) SpO2:  [97 %-100 %] 99 % (08/22 0746) Weight:  [54 kg (119 lb 1.6 oz)] 54 kg (119 lb 1.6 oz) (08/21 1447)  Weight change:  Filed Weights   11/21/16 1447  Weight: 54 kg (119 lb 1.6 oz)    Intake/Output:    Intake/Output Summary (Last 24 hours) at 11/22/16 1022 Last data filed at 11/22/16 0933  Gross per 24 hour  Intake           1430.3 ml  Output              800 ml  Net            630.3 ml     Physical Exam: General: Thin, no acute distress   HEENT Anicteric, moist oral mucous membranes   Neck Supple   Pulm/lungs Lungs are clear to auscultation   CVS/Heart Regular rhythm, no rub or gallop   Abdomen:  Soft, nontender   Extremities: No edema   Neurologic: Alert, oriented   Skin: No acute rashes   Access: To be placed        Basic Metabolic Panel:   Recent Labs Lab 11/15/16 1349 11/22/16 0338  NA 138 140  K 5.9* 4.9  CL 109 111  CO2 22 22  GLUCOSE 238* 104*  BUN 62* 67*  CREATININE 4.63* 4.32*  CALCIUM 8.4* 8.3*     CBC:  Recent Labs Lab 11/22/16 0338  WBC 7.6  HGB 8.1*  HCT 23.8*  MCV 92.3  PLT 294     No results found for: HEPBSAG, HEPBSAB, HEPBIGM    Microbiology:  No results found for this or any previous visit (from the past 240 hour(s)).  Coagulation Studies: No results for input(s): LABPROT, INR in the last 72 hours.  Urinalysis: No results for  input(s): COLORURINE, LABSPEC, PHURINE, GLUCOSEU, HGBUR, BILIRUBINUR, KETONESUR, PROTEINUR, UROBILINOGEN, NITRITE, LEUKOCYTESUR in the last 72 hours.  Invalid input(s): APPERANCEUR    Imaging: No results found.   Medications:   .  ceFAZolin (ANCEF) IV     . atorvastatin  10 mg Oral Daily  . cloNIDine  0.3 mg Transdermal Weekly  . heparin  5,000 Units Subcutaneous Q8H  . insulin aspart  0-5 Units Subcutaneous QHS  . insulin aspart  0-9 Units Subcutaneous TID WC  . metoprolol tartrate  50 mg Oral BID  . NIFEdipine  30 mg Oral Daily  . pantoprazole  40 mg Oral BID AC  . tuberculin  5 Units Intradermal Once   acetaminophen **OR** acetaminophen, hydrALAZINE, ondansetron **OR** ondansetron (ZOFRAN) IV  Assessment/ Plan:  66 y.o. female with long-standing diabetes, left ventricular hypertrophy, severe hypertension and chronic kidney disease. She was Dx with left ICA stenosis in October 2017. A 2-D echo at that time showed severe LVH and stage I diastolic dysfunction. LV EF was 65-70%. Her hemoglobin A1c was 6.1%.  1. Chronic kidney disease stage V. Patient has likely progressed  to end-stage renal disease.  Chronic kidney disease is likely secondary to nephrotic syndrome. Differential includes diabetic kidney disease, FSGS, membranous nephropathy.  - start HD today after catheter is available  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 Anemia of chronic kidney disease  - We will obtain iron studies and get patient started on ESA therapy as indicated   4. Proteinuria - Start ARB  today  5. Hyperkalemia - Dietary education provided by dietician   LOS: 1 [facility] 8/22/201810:22 AM  [facility] [place], [place] [phone]

## 2016-11-22 NOTE — Op Note (Signed)
[facility]   OPERATIVE NOTE     PROCEDURE: 1. Insertion of a right IJ tunneled dialysis catheter. 2. Catheter placement and cannulation under ultrasound and fluoroscopic guidance  PRE-OPERATIVE DIAGNOSIS: end-stage renal requiring hemodialysis  POST-OPERATIVE DIAGNOSIS: same as above  SURGEON: Levora Dredge  ANESTHESIA: Conscious sedation was administered under my direct supervision by the interventional radiology RN. IV Versed plus fentanyl were utilized. Continuous ECG, pulse oximetry and blood pressure was monitored throughout the entire procedure.  Conscious sedation was for a total of 27 minutes.  ESTIMATED BLOOD LOSS: Minimal  FINDING(S): 1.  Tips of the catheter in the right atrium on fluoroscopy 2.  No obvious pneumothorax on fluoroscopy  SPECIMEN(S):  none  INDICATIONS:   Brittany Bowman is a 66 y.o. female  presents with end stage renal disease.  Therefore, the patient requires a tunneled dialysis catheter placement.  The patient is informed of  the risks catheter placement include but are not limited to: bleeding, infection, central venous injury, pneumothorax, possible venous stenosis, possible malpositioning in the venous system, and possible infections related to long-term catheter presence.  The patient was aware of these risks and agreed to proceed.  DESCRIPTION: The patient was taken back to Special Procedure suite.  Prior to sedation, the patient was given IV antibiotics.  After obtaining adequate sedation, the patient was prepped and draped in the standard fashion for a chest or neck tunneled dialysis catheter placement.  Appropriate Time Out is called.     The the right neck and chest wall are then infiltrated with 1% Lidocaine with epinepherine.  A 19 cm tip to cuff Palindrome catheter is then selected, opened on the back table and prepped. Ultrasound is placed in a sterile sleeve.  Under ultrasound guidance, the right IJ vein is  examined and is noted to be echolucent and easily compressible indicating patency.   An image is recorded for the permanent record.  The right IJ vein is cannulated with the microneedle under direct ultrasound vissualization.  A Microwire followed by a micro sheath is then inserted without difficulty.   A J-wire was then advanced under fluoroscopic guidance into the inferior vena cava and the wire was secured.  Small counter incision was then made at the wire insertion site. A small pocket was fashioned with blunt dissection to allow easier passage of the cuff.  The dilator and peel-away sheath are then advanced over the wire under fluoroscopic guidance. The catheters and advanced through the peel-away sheath. It is approximated to the chest wall after verifying the tips at the atrial caval junction and an exit site is selected.  Small incision is made at the selected exit site and the tunneling device was passed subcutaneously to the neck counter incision. Catheter is then pulled through the subcutaneous tunnel. The catheter is then verified for tip position under fluoroscopy, transected and the hub assembly connected.    Each port was tested by aspirating and flushing.  No resistance was noted.  Each port was then thoroughly flushed with heparinized saline.  The catheter was secured in placed with two interrupted stitches of 0 silk tied to the catheter.  The neck incision was closed with a U-stitch of 4-0 Monocryl.  The neck and chest incision were cleaned and sterile bandages applied including a Biopatch.  Each port was then packed with concentrated heparin (1000 Units/mL) at the manufacturer recommended volumes to each port.  Sterile caps were applied to each port.  On completion fluoroscopy, the  tips of the catheter were in the right atrium, and there was no evidence of pneumothorax.  COMPLICATIONS: None  CONDITION: Unchanged   Levora Dredge [facility] Office:  [phone]   11/22/2016, 5:06 PM

## 2016-11-22 NOTE — Progress Notes (Signed)
[facility] [place] at [facility]   PATIENT NAME: Brittany Bowman    MR#:  [id number]  DATE OF BIRTH:  Jun 09, 1950  SUBJECTIVE:  CHIEF COMPLAINT:  No chief complaint on file.  The patient is a 66 year old African-American female with past medical history significant for history of COPD, stage IV, diabetes, hypertension, gastroesophageal reflux disease, hypertension, lipidemia, who presented to the hospital with worsening renal failure. She was initiated on IV fluids, her creatinine improved somewhat, she had no chest pains, shortness of breath, however, complained of nausea. Her blood pressure was noted to be markedly elevated around 200 systolic  Review of Systems  Constitutional: Negative for chills, fever and weight loss.  HENT: Negative for congestion.   Eyes: Negative for blurred vision and double vision.  Respiratory: Negative for cough, sputum production, shortness of breath and wheezing.   Cardiovascular: Negative for chest pain, palpitations, orthopnea, leg swelling and PND.  Gastrointestinal: Positive for abdominal pain and nausea. Negative for blood in stool, constipation, diarrhea and vomiting.  Genitourinary: Negative for dysuria, frequency, hematuria and urgency.  Musculoskeletal: Negative for falls.  Neurological: Negative for dizziness, tremors, focal weakness and headaches.  Endo/Heme/Allergies: Does not bruise/bleed easily.  Psychiatric/Behavioral: Negative for depression. The patient does not have insomnia.     VITAL SIGNS: Blood pressure (!) 179/76, pulse 90, temperature 98.7 F (37.1 C), temperature source Oral, resp. rate 18, height 5\' 8"  (1.727 m), weight 54 kg (119 lb 1.6 oz), SpO2 99 %.  PHYSICAL EXAMINATION:   GENERAL:  66 y.o.-year-old patient lying in the bed in mild discomfort due to abdominal discomfort as well as nausea EYES: Pupils equal, round, reactive to light and accommodation. No scleral icterus. Extraocular muscles  intact.  HEENT: Head atraumatic, normocephalic. Oropharynx and nasopharynx clear.  NECK:  Supple, no jugular venous distention. No thyroid enlargement, no tenderness.  LUNGS: Normal breath sounds bilaterally, no wheezing, rales,rhonchi or crepitation. No use of accessory muscles of respiration.  CARDIOVASCULAR: S1, S2 normal. No murmurs, rubs, or gallops.  ABDOMEN: Soft, nontender, nondistended. Bowel sounds present. No organomegaly or mass.  EXTREMITIES: No pedal edema, cyanosis, or clubbing.  NEUROLOGIC: Cranial nerves II through XII are intact. Muscle strength 5/5 in all extremities. Sensation intact. Gait not checked.  PSYCHIATRIC: The patient is alert and oriented x 3.  SKIN: No obvious rash, lesion, or ulcer.   ORDERS/RESULTS REVIEWED:   CBC  Recent Labs Lab 11/22/16 0338  WBC 7.6  HGB 8.1*  HCT 23.8*  PLT 294  MCV 92.3  MCH 31.6  MCHC 34.2  RDW 12.2   ------------------------------------------------------------------------------------------------------------------  Chemistries   Recent Labs Lab 11/15/16 1349 11/22/16 0338  NA 138 140  K 5.9* 4.9  CL 109 111  CO2 22 22  GLUCOSE 238* 104*  BUN 62* 67*  CREATININE 4.63* 4.32*  CALCIUM 8.4* 8.3*   ------------------------------------------------------------------------------------------------------------------ estimated creatinine clearance is 11.1 mL/min (A) (by C-G formula based on SCr of 4.32 mg/dL (H)). ------------------------------------------------------------------------------------------------------------------ No results for input(s): TSH, T4TOTAL, T3FREE, THYROIDAB in the last 72 hours.  Invalid input(s): FREET3  Cardiac Enzymes No results for input(s): CKMB, TROPONINI, MYOGLOBIN in the last 168 hours.  Invalid input(s): CK ------------------------------------------------------------------------------------------------------------------ Invalid input(s):  POCBNP ---------------------------------------------------------------------------------------------------------------  RADIOLOGY: No results found.  EKG: No orders found for this or any previous visit.  ASSESSMENT AND PLAN:  Active Problems:   Acute on chronic renal failure (HCC)  #1. Acute on chronic renal failure, underlying CTD stage V, discussed with Dr. Thedore Mins, likely progress  to end-stage renal disease, patient will be receiving permacath today for possible dialysis soon, discontinue IV fluids #2. Malignant essential hypertension with worsening kidney function, discontinue IV fluids, initiate patient on: Didn't topically, continue metoprolol, Avapro, follow closely #3. Anemia of CK D, getting iron studies, patient will be started on Epogen therapy by nephrologist #4. Diabetes mellitus type 2, continue sliding scale insulin, blood glucose is ranging between 85-150, continue diabetic diet, will need to change to renal/diabetic diet with fluid restrictions #5. Nausea, suspected hypertensive encephalopathy versus uremia, follow with therapy, continue patient on a diet, antiemetics  Management plans discussed with the patient, family and they are in agreement.   DRUG ALLERGIES:  Allergies  Allergen Reactions  . Aspirin   . Nsaids     CODE STATUS:     Code Status Orders        Start     Ordered   11/21/16 1446  Full code  Continuous     11/21/16 1446    Code Status History    Date Active Date Inactive Code Status Order ID Comments User Context   This patient has a current code status but no historical code status.      TOTAL TIME TAKING CARE OF THIS PATIENT: 40 minutes.    Katharina Caper M.D on 11/22/2016 at 1:34 PM  Between 7am to 6pm - Pager - [phone]  After 6pm go to [email or web address] - password [email or web address] [facility]  [place] [facility]  Office  [phone]  CC: Primary care physician; Velia Meyer, MD

## 2016-11-22 NOTE — Progress Notes (Signed)
Post HD  

## 2016-11-22 NOTE — Progress Notes (Signed)
Nutrition Education Note  RD educated pt on a renal diet today.   Explained why diet restrictions are needed and provided lists of foods to limit/avoid that are high potassium, sodium, and phosphorus. Provided specific recommendations on safer alternatives of these foods. Strongly encouraged compliance of this diet.   Discussed importance of protein intake at each meal and snack. Provided examples of how to maximize protein intake throughout the day. Discussed need for fluid restriction with dialysis, importance of minimizing weight gain between HD treatments, and renal-friendly beverage options.  Encouraged pt to discuss specific diet questions/concerns with RD at HD outpatient facility. Teach back method used.  Expect good compliance.  Body mass index is 18.11 kg/m. Pt meets criteria for underweight based on current BMI.  Current diet order is clear liquid, patient is consuming approximately 100% of meals at this time  RD is following this pt  Betsey Holiday MS, RD, LDN Pager #[phone] After Hours Pager: [phone]

## 2016-11-22 NOTE — Progress Notes (Addendum)
Initial Nutrition Assessment  DOCUMENTATION CODES:   Non-severe (moderate) malnutrition in context of chronic illness  INTERVENTION:   Nepro Shake po BID, each supplement provides 425 kcal and 19 grams protein  MVI  NUTRITION DIAGNOSIS:   Malnutrition (moderate) related to chronic illness (ESRD, DM) as evidenced by moderate depletion of body fat, moderate depletions of muscle mass.  GOAL:   Patient will meet greater than or equal to 90% of their needs  MONITOR:   PO intake, Supplement acceptance, Labs, Weight trends, I & O's  REASON FOR ASSESSMENT:   Malnutrition Screening Tool    ASSESSMENT:   66 y.o. female with a known history of diabetes, hypertension, GERD, hyperlipidemia, chronic kidney disease stage IV who presents to the hospital due to worsening renal failure and function likely progressing to ESRD.    Met with pt in room today. Pt reports good appetite today and pta. Pt reports a long history of "morning sickness" where she will wake up with nausea and vomit up liquid and phlegm. She also reports intermittent diarrhea that seems to occur around the times she is having N/V. Pt reports that her doctor felt like she may have gastroparesis and she was scheduled to have a gastric emptying study recently but was unable to show up for her appointment. Pt reports a 25lb wt loss over the past 2 years. Per chart, pt has lost 8lbs(6%) over the past year which is not significant. Per nephology note, pt to initiate on HD. Plan for [facility] placement. RD educated pt today regarding a renal friendly diet and regarding possible gastroparesis. Pt is currently eating 100% of her clear liquid diet. Pt is already ordered for Nepro.   Medications reviewed and include: heparin, insulin, protonix  Labs reviewed: BUN 67(H), creat 4.32(H), Ca 8.3(L) Hgb 8.1(L), 23.8(L)  Nutrition-Focused physical exam completed. Findings are moderate fat depletions in arms and chest, moderate muscle  depletions over entire body, and no edema.   Diet Order:  Diet NPO time specified  Skin:  Reviewed, no issues  Last BM:  8/21  Height:   Ht Readings from Last 1 Encounters:  11/21/16 5' 8"  (1.727 m)    Weight:   Wt Readings from Last 1 Encounters:  11/21/16 119 lb 1.6 oz (54 kg)    Ideal Body Weight:  63.6 kg  BMI:  Body mass index is 18.11 kg/m.  Estimated Nutritional Needs:   Kcal:  1600-1900kcal/day   Protein:  86-97g/day   Fluid:  >1.6L/day   EDUCATION NEEDS:   Education needs addressed  Koleen Distance MS, RD, LDN Pager #[phone] After Hours Pager: [phone]

## 2016-11-22 NOTE — Progress Notes (Signed)
HD tx ended 

## 2016-11-22 NOTE — Progress Notes (Signed)
HD tx started  

## 2016-11-23 ENCOUNTER — Inpatient Hospital Stay: Payer: Medicare HMO

## 2016-11-23 ENCOUNTER — Encounter: Payer: Self-pay | Admitting: Vascular Surgery

## 2016-11-23 DIAGNOSIS — E440 Moderate protein-calorie malnutrition: Secondary | ICD-10-CM | POA: Insufficient documentation

## 2016-11-23 LAB — BASIC METABOLIC PANEL
ANION GAP: 7 (ref 5–15)
BUN: 39 mg/dL — AB (ref 6–20)
CO2: 26 mmol/L (ref 22–32)
Calcium: 8.3 mg/dL — ABNORMAL LOW (ref 8.9–10.3)
Chloride: 105 mmol/L (ref 101–111)
Creatinine, Ser: 3.17 mg/dL — ABNORMAL HIGH (ref 0.44–1.00)
GFR calc Af Amer: 17 mL/min — ABNORMAL LOW (ref >60)
GFR, EST NON AFRICAN AMERICAN: 14 mL/min — AB (ref >60)
Glucose, Bld: 129 mg/dL — ABNORMAL HIGH (ref 65–99)
POTASSIUM: 4.7 mmol/L (ref 3.5–5.1)
SODIUM: 138 mmol/L (ref 135–145)

## 2016-11-23 LAB — IRON AND TIBC
IRON: 19 ug/dL — AB (ref 28–170)
Saturation Ratios: 15 % (ref 10.4–31.8)
TIBC: 126 ug/dL — AB (ref 250–450)
UIBC: 107 ug/dL

## 2016-11-23 LAB — TRANSFERRIN: TRANSFERRIN: 153 mg/dL — AB (ref 192–382)

## 2016-11-23 LAB — HEMOGLOBIN: HEMOGLOBIN: 7.9 g/dL — AB (ref 12.0–16.0)

## 2016-11-23 LAB — FERRITIN: Ferritin: 1257 ng/mL — ABNORMAL HIGH (ref 11–307)

## 2016-11-23 LAB — GLUCOSE, CAPILLARY
GLUCOSE-CAPILLARY: 274 mg/dL — AB (ref 65–99)
Glucose-Capillary: 111 mg/dL — ABNORMAL HIGH (ref 65–99)
Glucose-Capillary: 156 mg/dL — ABNORMAL HIGH (ref 65–99)

## 2016-11-23 MED ORDER — DIPHENHYDRAMINE HCL 25 MG PO CAPS
25.0000 mg | ORAL_CAPSULE | Freq: Four times a day (QID) | ORAL | Status: DC | PRN
  Administered 2016-11-23 – 2016-11-30 (×6): 25 mg via ORAL
  Filled 2016-11-23 (×5): qty 1

## 2016-11-23 MED ORDER — HYDRALAZINE HCL 20 MG/ML IJ SOLN
10.0000 mg | INTRAMUSCULAR | Status: DC | PRN
  Filled 2016-11-23: qty 1

## 2016-11-23 MED ORDER — LABETALOL HCL 5 MG/ML IV SOLN
10.0000 mg | INTRAVENOUS | Status: DC | PRN
  Administered 2016-11-26: 10 mg via INTRAVENOUS
  Filled 2016-11-23: qty 4

## 2016-11-23 MED ORDER — DIPHENHYDRAMINE HCL 25 MG PO CAPS
ORAL_CAPSULE | ORAL | Status: AC
  Filled 2016-11-23: qty 1

## 2016-11-23 MED ORDER — LABETALOL HCL 100 MG PO TABS
100.0000 mg | ORAL_TABLET | Freq: Two times a day (BID) | ORAL | Status: DC
  Administered 2016-11-23 – 2016-12-01 (×14): 100 mg via ORAL
  Filled 2016-11-23 (×18): qty 1

## 2016-11-23 NOTE — Progress Notes (Signed)
[facility] [place], [place] 11/23/16  Subjective:   Patient known to our practice from outpatient. She is followed for proteinuria, hypertension and chronic kidney disease Over the past several months, she has had a progressive decline in her renal function. Serologies have been negative.  permcath placed yesterday. Patient tolerated her 1st HD well Seen during HD today   HEMODIALYSIS FLOWSHEET:  Blood Flow Rate (mL/min): 300 mL/min Arterial Pressure (mmHg): -100 mmHg Venous Pressure (mmHg): 100 mmHg Transmembrane Pressure (mmHg): 60 mmHg Ultrafiltration Rate (mL/min): 330 mL/min Dialysate Flow Rate (mL/min): 500 ml/min Conductivity: Machine : 13.9 Conductivity: Machine : 13.9 Dialysis Fluid Bolus: Normal Saline Bolus Amount (mL): 250 mL    Objective:  Vital signs in last 24 hours:  Temp:  [98 F (36.7 C)-99.7 F (37.6 C)] 99.1 F (37.3 C) (08/23 1000) Pulse Rate:  [81-98] 88 (08/23 1130) Resp:  [10-22] 20 (08/23 1200) BP: (155-198)/(64-92) 185/92 (08/23 1200) SpO2:  [96 %-100 %] 99 % (08/23 1000) Weight:  [54 kg (119 lb)-56.5 kg (124 lb 9 oz)] 54.2 kg (119 lb 7.8 oz) (08/23 1000)  Weight change: -0.045 kg (-1.6 oz) Filed Weights   11/22/16 1610 11/22/16 1950 11/23/16 1000  Weight: 54 kg (119 lb) 56.5 kg (124 lb 9 oz) 54.2 kg (119 lb 7.8 oz)    Intake/Output:    Intake/Output Summary (Last 24 hours) at 11/23/16 1228 Last data filed at 11/23/16 0752  Gross per 24 hour  Intake               50 ml  Output             1000 ml  Net             -950 ml     Physical Exam: General: Thin, no acute distress   HEENT Anicteric, moist oral mucous membranes   Neck Supple   Pulm/lungs Lungs are clear to auscultation   CVS/Heart Regular rhythm, no rub or gallop   Abdomen:  Soft, nontender   Extremities: No edema   Neurologic: Alert, oriented   Skin: No acute rashes   Access: Rt IJ PC       Basic Metabolic Panel:   Recent Labs Lab  11/22/16 0338 11/23/16 0335  NA 140 138  K 4.9 4.7  CL 111 105  CO2 22 26  GLUCOSE 104* 129*  BUN 67* 39*  CREATININE 4.32* 3.17*  CALCIUM 8.3* 8.3*     CBC:  Recent Labs Lab 11/22/16 0338 11/23/16 0335  WBC 7.6  --   HGB 8.1* 7.9*  HCT 23.8*  --   MCV 92.3  --   PLT 294  --      No results found for: HEPBSAG, HEPBSAB, HEPBIGM    Microbiology:  No results found for this or any previous visit (from the past 240 hour(s)).  Coagulation Studies: No results for input(s): LABPROT, INR in the last 72 hours.  Urinalysis: No results for input(s): COLORURINE, LABSPEC, PHURINE, GLUCOSEU, HGBUR, BILIRUBINUR, KETONESUR, PROTEINUR, UROBILINOGEN, NITRITE, LEUKOCYTESUR in the last 72 hours.  Invalid input(s): APPERANCEUR    Imaging: No results found.   Medications:    . atorvastatin  10 mg Oral Daily  . cloNIDine  0.3 mg Transdermal Weekly  . feeding supplement (NEPRO CARB STEADY)  237 mL Oral BID BM  . heparin  5,000 Units Subcutaneous Q8H  . insulin aspart  0-5 Units Subcutaneous QHS  . insulin aspart  0-9 Units Subcutaneous TID WC  .  irbesartan  150 mg Oral QHS  . labetalol  100 mg Oral BID  . multivitamin  1 tablet Oral QHS  . NIFEdipine  30 mg Oral Daily  . pantoprazole  40 mg Oral BID AC  . tuberculin  5 Units Intradermal Once   acetaminophen **OR** acetaminophen, diphenhydrAMINE, hydrALAZINE, ondansetron **OR** ondansetron (ZOFRAN) IV  Assessment/ Plan:  66 y.o. female with long-standing diabetes, left ventricular hypertrophy, severe hypertension and chronic kidney disease. She was Dx with left ICA stenosis in October 2017. A 2-D echo at that time showed severe LVH and stage I diastolic dysfunction. LV EF was 65-70%. Her hemoglobin A1c was 6.1%.  1. End-stage renal disease.  Chronic kidney disease is likely secondary to nephrotic syndrome. Differential includes diabetic kidney disease, FSGS, membranous nephropathy.  - HD started 11/22/2016 Patient seen  during dialysis Tolerating well  Outpatient d/c planning for  [place]   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 Anemia of chronic kidney disease  - We will obtain iron studies and get patient started on ESA therapy as indicated   4. Proteinuria - continue  ARB   5. Hyperkalemia - Dietary education provided by dietician  6. HTN - ARB started. BP control is better   LOS: 2 [facility] [place] [facility] 8/23/201812:28 PM  [facility] [place], [place] [phone]

## 2016-11-23 NOTE — Progress Notes (Signed)
PRE DIALYSIS ASSESSMENT 

## 2016-11-23 NOTE — Progress Notes (Signed)
HD ended 

## 2016-11-23 NOTE — Progress Notes (Signed)
HD STARTED  

## 2016-11-23 NOTE — Progress Notes (Signed)
[facility] - [place] at [facility]   PATIENT NAME: Brittany Bowman    MR#:  [id number]  DATE OF BIRTH:  01-23-1951  SUBJECTIVE:  CHIEF COMPLAINT:  No chief complaint on file.  The patient is a 66 year old African-American female with past medical history significant for history of COPD, stage IV, diabetes, hypertension, gastroesophageal reflux disease, hypertension, lipidemia, who presented to the hospital with worsening renal failure. She was initiated on IV fluids, her creatinine improved somewhat, she had no chest pains, shortness of breath, however, complained of nausea. Her blood pressure was noted to be markedly elevated around 200 systolic. At that point her IV fluids were discontinued, patient had a permacath placed and was initiated on hemodialysis, per recommendation of Dr. Thedore Mins, nephrologist. She tolerated hemodialysis well read her blood pressure has improved with dialysis and medications. She denies any headaches, nausea and feels comfortable today, she seen during hemodialysis session today  Review of Systems  Constitutional: Negative for chills, fever and weight loss.  HENT: Negative for congestion.   Eyes: Negative for blurred vision and double vision.  Respiratory: Negative for cough, sputum production, shortness of breath and wheezing.   Cardiovascular: Negative for chest pain, palpitations, orthopnea, leg swelling and PND.  Gastrointestinal: Negative for blood in stool, constipation, diarrhea and vomiting.  Genitourinary: Negative for dysuria, frequency, hematuria and urgency.  Musculoskeletal: Negative for falls.  Neurological: Negative for dizziness, tremors, focal weakness and headaches.  Endo/Heme/Allergies: Does not bruise/bleed easily.  Psychiatric/Behavioral: Negative for depression. The patient does not have insomnia.     VITAL SIGNS: Blood pressure (!) 184/85, pulse 84, temperature 99.1 F (37.3 C), temperature source Oral, resp.  rate 18, height 5\' 8"  (1.727 m), weight 54.2 kg (119 lb 7.8 oz), SpO2 100 %.  PHYSICAL EXAMINATION:   GENERAL:  66 y.o.-year-old patient lying in the bed in mild discomfort due to abdominal discomfort as well as nausea EYES: Pupils equal, round, reactive to light and accommodation. No scleral icterus. Extraocular muscles intact.  HEENT: Head atraumatic, normocephalic. Oropharynx and nasopharynx clear.  NECK:  Supple, no jugular venous distention. No thyroid enlargement, no tenderness.  LUNGS: Normal breath sounds bilaterally, no wheezing, rales,rhonchi or crepitation. No use of accessory muscles of respiration.  CARDIOVASCULAR: S1, S2 normal. No murmurs, rubs, or gallops.  ABDOMEN: Soft, nontender, nondistended. Bowel sounds present. No organomegaly or mass.  EXTREMITIES: No pedal edema, cyanosis, or clubbing.  NEUROLOGIC: Cranial nerves II through XII are intact. Muscle strength 5/5 in all extremities. Sensation intact. Gait not checked.  PSYCHIATRIC: The patient is alert and oriented x 3.  SKIN: No obvious rash, lesion, or ulcer.   ORDERS/RESULTS REVIEWED:   CBC  Recent Labs Lab 11/22/16 0338 11/23/16 0335  WBC 7.6  --   HGB 8.1* 7.9*  HCT 23.8*  --   PLT 294  --   MCV 92.3  --   MCH 31.6  --   MCHC 34.2  --   RDW 12.2  --    ------------------------------------------------------------------------------------------------------------------  Chemistries   Recent Labs Lab 11/22/16 0338 11/23/16 0335  NA 140 138  K 4.9 4.7  CL 111 105  CO2 22 26  GLUCOSE 104* 129*  BUN 67* 39*  CREATININE 4.32* 3.17*  CALCIUM 8.3* 8.3*   ------------------------------------------------------------------------------------------------------------------ estimated creatinine clearance is 15.1 mL/min (A) (by C-G formula based on SCr of 3.17 mg/dL (H)). ------------------------------------------------------------------------------------------------------------------ No results for  input(s): TSH, T4TOTAL, T3FREE, THYROIDAB in the last 72 hours.  Invalid input(s): FREET3  Cardiac Enzymes No results for input(s): CKMB, TROPONINI, MYOGLOBIN in the last 168 hours.  Invalid input(s): CK ------------------------------------------------------------------------------------------------------------------ Invalid input(s): POCBNP ---------------------------------------------------------------------------------------------------------------  RADIOLOGY: No results found.  EKG: No orders found for this or any previous visit.  ASSESSMENT AND PLAN:  Active Problems:   Acute on chronic renal failure (HCC)   Malnutrition of moderate degree  #1. 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 Gilda Crease, initiated on for his dialysis the same day, second dialysis session today, tolerates it well, complains of mild chest pain at dialysis insertion site, discussed with Dr. Thedore Mins #2. Malignant essential hypertension with worsening kidney function, improved with Avapro, change metoprolol to labetalol, follow blood pressure readings closely, advance medications as needed #3. 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 Diabetes mellitus type 2, continue sliding scale insulin, blood glucose is ranging between 53-111, change diet to renal/diabetic diet with fluid restrictions, watch blood glucose levels very closely as patient tends to drop blood glucose levels significantly #5. Nausea, suspected hypertensive encephalopathy versus uremia, improved with therapy, continue patient on a diet, antiemetics #6. Malnutrition of moderate degree, get that involved for further recommendations   Management plans discussed with the patient, family and  they are in agreement.   DRUG ALLERGIES:  Allergies  Allergen Reactions  . Aspirin   . Nsaids     CODE STATUS:     Code Status Orders        Start     Ordered   11/21/16 1446  Full code  Continuous     11/21/16 1446    Code Status History    Date Active Date Inactive Code Status Order ID Comments User Context   This patient has a current code status but no historical code status.      TOTAL TIME TAKING CARE OF THIS PATIENT: 30 minutes.    Katharina Caper M.D on 11/23/2016 at 1:02 PM  Between 7am to 6pm - Pager - [phone]  After 6pm go to [email or web address] - password [email or web address]  [place] [facility]  Office  [phone]  CC: Primary care physician; Velia Meyer, MD

## 2016-11-24 LAB — HEPATITIS B SURFACE ANTIBODY, QUANTITATIVE

## 2016-11-24 LAB — HEPATITIS B SURFACE ANTIGEN
HEP B S AG: NEGATIVE
Hepatitis B Surface Ag: NEGATIVE

## 2016-11-24 LAB — GLUCOSE, CAPILLARY
GLUCOSE-CAPILLARY: 260 mg/dL — AB (ref 65–99)
GLUCOSE-CAPILLARY: 171 mg/dL — AB (ref 65–99)
Glucose-Capillary: 178 mg/dL — ABNORMAL HIGH (ref 65–99)
Glucose-Capillary: 176 mg/dL — ABNORMAL HIGH (ref 65–99)
Glucose-Capillary: 221 mg/dL — ABNORMAL HIGH (ref 65–99)

## 2016-11-24 LAB — HEPATITIS B CORE ANTIBODY, TOTAL: Hep B Core Total Ab: NEGATIVE

## 2016-11-24 LAB — HEPATITIS B SURFACE ANTIBODY,QUALITATIVE: HEP B S AB: NONREACTIVE

## 2016-11-24 MED ORDER — IRBESARTAN 150 MG PO TABS
300.0000 mg | ORAL_TABLET | Freq: Every day | ORAL | Status: DC
  Administered 2016-11-24 – 2016-11-30 (×7): 300 mg via ORAL
  Filled 2016-11-24 (×7): qty 2

## 2016-11-24 NOTE — Progress Notes (Signed)
Patient's PPD read as negative. 

## 2016-11-24 NOTE — Clinical Social Work Note (Addendum)
Brittany Bowman with dialysis stated late today that due to needing a dialysis in [facility], because patient lives in [place], that she will have to pursue [facility] and will not be able to lock in a schedule until beginning of next week. Thus patient will not be able to discharge over the weekend. York Spaniel MSW,LcSW [phone]

## 2016-11-24 NOTE — Progress Notes (Signed)
[facility] [place], [place] 11/24/16  Subjective:   Patient known to our practice from outpatient. She is followed for proteinuria, hypertension and chronic kidney disease Over the past several months, she has had a progressive decline in her renal function. Serologies have been negative.  permcath placed yesterday. Patient tolerated her 1st  HD well 2nd HD was 8/23   Objective:  Vital signs in last 24 hours:  Temp:  [98.2 F (36.8 C)-99.2 F (37.3 C)] 98.4 F (36.9 C) (08/24 1300) Pulse Rate:  [89-97] 96 (08/24 1300) Resp:  [17-18] 18 (08/24 1300) BP: (98-200)/(38-78) 159/62 (08/24 1300) SpO2:  [98 %-100 %] 99 % (08/24 1300)  Weight change: 0.222 kg (7.8 oz) Filed Weights   11/22/16 1610 11/22/16 1950 11/23/16 1000  Weight: 54 kg (119 lb) 56.5 kg (124 lb 9 oz) 54.2 kg (119 lb 7.8 oz)    Intake/Output:    Intake/Output Summary (Last 24 hours) at 11/24/16 1332 Last data filed at 11/24/16 0037  Gross per 24 hour  Intake              296 ml  Output              500 ml  Net             -204 ml     Physical Exam: General: Thin, no acute distress   HEENT Anicteric, moist oral mucous membranes   Neck Supple   Pulm/lungs Lungs are clear to auscultation   CVS/Heart Regular rhythm, no rub or gallop   Abdomen:  Soft, nontender   Extremities: No edema   Neurologic: Alert, oriented   Skin: No acute rashes   Access: Rt IJ PC       Basic Metabolic Panel:   Recent Labs Lab 11/22/16 0338 11/23/16 0335  NA 140 138  K 4.9 4.7  CL 111 105  CO2 22 26  GLUCOSE 104* 129*  BUN 67* 39*  CREATININE 4.32* 3.17*  CALCIUM 8.3* 8.3*     CBC:  Recent Labs Lab 11/22/16 0338 11/23/16 0335  WBC 7.6  --   HGB 8.1* 7.9*  HCT 23.8*  --   MCV 92.3  --   PLT 294  --       Lab Results  Component Value Date   HEPBSAG Negative 11/23/2016   HEPBSAB Non Reactive 11/23/2016      Microbiology:  No results found for this or any previous visit  (from the past 240 hour(s)).  Coagulation Studies: No results for input(s): LABPROT, INR in the last 72 hours.  Urinalysis: No results for input(s): COLORURINE, LABSPEC, PHURINE, GLUCOSEU, HGBUR, BILIRUBINUR, KETONESUR, PROTEINUR, UROBILINOGEN, NITRITE, LEUKOCYTESUR in the last 72 hours.  Invalid input(s): APPERANCEUR    Imaging: No results found.   Medications:    . atorvastatin  10 mg Oral Daily  . feeding supplement (NEPRO CARB STEADY)  237 mL Oral BID BM  . heparin  5,000 Units Subcutaneous Q8H  . insulin aspart  0-5 Units Subcutaneous QHS  . insulin aspart  0-9 Units Subcutaneous TID WC  . irbesartan  300 mg Oral QHS  . labetalol  100 mg Oral BID  . multivitamin  1 tablet Oral QHS  . NIFEdipine  30 mg Oral Daily  . pantoprazole  40 mg Oral BID AC   acetaminophen **OR** acetaminophen, diphenhydrAMINE, hydrALAZINE, labetalol, ondansetron **OR** ondansetron (ZOFRAN) IV  Assessment/ Plan:  66 y.o. female with long-standing diabetes, left ventricular hypertrophy, severe hypertension and chronic  kidney disease. She was Dx with left ICA stenosis in October 2017. A 2-D echo at that time showed severe LVH and stage I diastolic dysfunction. LV EF was 65-70%. Her hemoglobin A1c was 6.1%.  1. End-stage renal disease.  Chronic kidney disease is likely secondary to nephrotic syndrome from DM Other differential Dx includes FSGS, membranous nephropathy.  - HD started 11/22/2016 Outpatient d/c planning for [place] as patient is planning to live in [facility] Twice weekly HD as patient has good RRF  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 Anemia of chronic kidney disease  - We will obtain iron studies and get patient started on ESA therapy as indicated   4. Proteinuria - continue  ARB   5. Hyperkalemia - Dietary education provided by dietician  6. HTN - ARB started. BP control is better - dose increased to 300   LOS:  3 [facility] [place] 8/24/20181:32 PM  [facility] [place], [place] [phone]

## 2016-11-24 NOTE — Progress Notes (Signed)
[facility] - [place] at [facility]   PATIENT NAME: Brittany Bowman    MR#:  [id number]  DATE OF BIRTH:  Mar 27, 1951  SUBJECTIVE:  CHIEF COMPLAINT:  No chief complaint on file.  The patient is a 66 year old African-American female with past medical history significant for history of COPD, stage IV, diabetes, hypertension, gastroesophageal reflux disease, hypertension, lipidemia, who presented to the hospital with worsening renal failure. She was initiated on IV fluids, her creatinine improved somewhat, she had no chest pains, shortness of breath, however, complained of nausea. Her blood pressure was noted to be markedly elevated around 200 systolic. At that point her IV fluids were discontinued, patient had a permacath placed and was initiated on hemodialysis, per recommendation of Dr. Thedore Mins, nephrologist. She tolerated hemodialysis well , her blood pressure improved with dialysis and medications. She denies nausea and feels comfortable today, she is to have hemodialysis session tomorrow and likely be discharged after that to home. Hemodialysis slot is available for her for Tuesday, Thursday, Saturday.   Review of Systems  Constitutional: Negative for chills, fever and weight loss.  HENT: Negative for congestion.   Eyes: Negative for blurred vision and double vision.  Respiratory: Negative for cough, sputum production, shortness of breath and wheezing.   Cardiovascular: Negative for chest pain, palpitations, orthopnea, leg swelling and PND.  Gastrointestinal: Negative for blood in stool, constipation, diarrhea and vomiting.  Genitourinary: Negative for dysuria, frequency, hematuria and urgency.  Musculoskeletal: Negative for falls.  Neurological: Negative for dizziness, tremors, focal weakness and headaches.  Endo/Heme/Allergies: Does not bruise/bleed easily.  Psychiatric/Behavioral: Negative for depression. The patient does not have insomnia.     VITAL SIGNS:  Blood pressure (!) 160/59, pulse 97, temperature 99.2 F (37.3 C), temperature source Oral, resp. rate 17, height 5\' 8"  (1.727 m), weight 54.2 kg (119 lb 7.8 oz), SpO2 98 %.  PHYSICAL EXAMINATION:   GENERAL:  66 y.o.-year-old patient lying in the bed in mild discomfort due to abdominal discomfort as well as nausea EYES: Pupils equal, round, reactive to light and accommodation. No scleral icterus. Extraocular muscles intact.  HEENT: Head atraumatic, normocephalic. Oropharynx and nasopharynx clear.  NECK:  Supple, no jugular venous distention. No thyroid enlargement, no tenderness.  LUNGS: Normal breath sounds bilaterally, no wheezing, rales,rhonchi or crepitation. No use of accessory muscles of respiration.  CARDIOVASCULAR: S1, S2 normal. No murmurs, rubs, or gallops.  ABDOMEN: Soft, nontender, nondistended. Bowel sounds present. No organomegaly or mass.  EXTREMITIES: No pedal edema, cyanosis, or clubbing.  NEUROLOGIC: Cranial nerves II through XII are intact. Muscle strength 5/5 in all extremities. Sensation intact. Gait not checked.  PSYCHIATRIC: The patient is alert and oriented x 3.  SKIN: No obvious rash, lesion, or ulcer.   ORDERS/RESULTS REVIEWED:   CBC  Recent Labs Lab 11/22/16 0338 11/23/16 0335  WBC 7.6  --   HGB 8.1* 7.9*  HCT 23.8*  --   PLT 294  --   MCV 92.3  --   MCH 31.6  --   MCHC 34.2  --   RDW 12.2  --    ------------------------------------------------------------------------------------------------------------------  Chemistries   Recent Labs Lab 11/22/16 0338 11/23/16 0335  NA 140 138  K 4.9 4.7  CL 111 105  CO2 22 26  GLUCOSE 104* 129*  BUN 67* 39*  CREATININE 4.32* 3.17*  CALCIUM 8.3* 8.3*   ------------------------------------------------------------------------------------------------------------------ estimated creatinine clearance is 15.1 mL/min (A) (by C-G formula based on SCr of 3.17 mg/dL  (H)). ------------------------------------------------------------------------------------------------------------------ No  results for input(s): TSH, T4TOTAL, T3FREE, THYROIDAB in the last 72 hours.  Invalid input(s): FREET3  Cardiac Enzymes No results for input(s): CKMB, TROPONINI, MYOGLOBIN in the last 168 hours.  Invalid input(s): CK ------------------------------------------------------------------------------------------------------------------ Invalid input(s): POCBNP ---------------------------------------------------------------------------------------------------------------  RADIOLOGY: No results found.  EKG: No orders found for this or any previous visit.  ASSESSMENT AND PLAN:  Active Problems:   Acute on chronic renal failure (HCC)   Malnutrition of moderate degree  #1. 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 Gilda Crease, initiated on hemodialysis the same day, second dialysis session  11/23/2016, next Tomorrow, then the being discharged to home, hemodialysis slot is available for Tuesday, Thursday, Saturday, discussed with Dr. Thedore Mins today #2. Malignant essential hypertension with worsening kidney function,  continue Avapro,  labetalol, follow blood pressure readings closely, advance medications as needed. Added Procardia by nephrologist #3. Anemia of CK D, good iron, ferritin levels, patient receiving Epogen by nephrologist #4. Diabetes mellitus type 2, continue sliding scale insulin, blood glucose is ranging between 178-270, continue renal/diabetic diet with fluid restrictions, watch blood glucose levels very closely as patient tends to drop blood glucose levels significantly #5. Nausea, suspected uremia related, improved with therapy #6. Malnutrition of moderate degree, appreciate  dietary recommendations, now on Nepro carb steady nutritional supplement   Management plans discussed with the patient, family and they are in agreement.   DRUG ALLERGIES:  Allergies  Allergen Reactions  . Aspirin   . Nsaids     CODE STATUS:     Code Status Orders        Start     Ordered   11/21/16 1446  Full code  Continuous     11/21/16 1446    Code Status History    Date Active Date Inactive Code Status Order ID Comments User Context   This patient has a current code status but no historical code status.      TOTAL TIME TAKING CARE OF THIS PATIENT: 35 minutes.  Discussed with Dr. Lorelee Cover M.D on 11/24/2016 at 12:53 PM  Between 7am to 6pm - Pager - [phone]  After 6pm go to [email or web address] - password [email or web address] [facility]  [place] [facility]  Office  [phone]  CC: Primary care physician; Velia Meyer, MD

## 2016-11-24 NOTE — Progress Notes (Signed)
Clonidine patch removed per verbal from Dr. Winona Legato.

## 2016-11-25 LAB — GLUCOSE, CAPILLARY
GLUCOSE-CAPILLARY: 171 mg/dL — AB (ref 65–99)
GLUCOSE-CAPILLARY: 114 mg/dL — AB (ref 65–99)
Glucose-Capillary: 236 mg/dL — ABNORMAL HIGH (ref 65–99)
Glucose-Capillary: 311 mg/dL — ABNORMAL HIGH (ref 65–99)

## 2016-11-25 LAB — PREPARE RBC (CROSSMATCH)

## 2016-11-25 LAB — CBC
HEMATOCRIT: 20.1 % — AB (ref 35.0–47.0)
HEMOGLOBIN: 6.9 g/dL — AB (ref 12.0–16.0)
MCH: 31.9 pg (ref 26.0–34.0)
MCHC: 34.3 g/dL (ref 32.0–36.0)
MCV: 93 fL (ref 80.0–100.0)
Platelets: 220 10*3/uL (ref 150–440)
RBC: 2.16 MIL/uL — AB (ref 3.80–5.20)
RDW: 12.4 % (ref 11.5–14.5)
WBC: 8.1 10*3/uL (ref 3.6–11.0)

## 2016-11-25 LAB — PROTEIN / CREATININE RATIO, URINE
Creatinine, Urine: 90 mg/dL
PROTEIN CREATININE RATIO: 1.49 mg/mg{creat} — AB (ref 0.00–0.15)
TOTAL PROTEIN, URINE: 134 mg/dL

## 2016-11-25 LAB — ABO/RH: ABO/RH(D): A POS

## 2016-11-25 MED ORDER — NIFEDIPINE ER 60 MG PO TB24: 60.0000 mg | ORAL_TABLET | Freq: Every day | ORAL | Status: DC

## 2016-11-25 MED ORDER — NIFEDIPINE ER 60 MG PO TB24
60.0000 mg | ORAL_TABLET | Freq: Every day | ORAL | Status: DC
  Administered 2016-11-26 – 2016-11-30 (×5): 60 mg via ORAL
  Filled 2016-11-25 (×6): qty 1

## 2016-11-25 MED ORDER — EPOETIN ALFA 10000 UNIT/ML IJ SOLN
4000.0000 [IU] | INTRAMUSCULAR | Status: DC
  Administered 2016-11-25 – 2016-11-30 (×3): 4000 [IU] via INTRAVENOUS

## 2016-11-25 MED ORDER — SODIUM CHLORIDE 0.9 % IV SOLN: Freq: Once | INTRAVENOUS | Status: DC

## 2016-11-25 NOTE — Progress Notes (Signed)
[facility] [place], [place] 11/25/16  Subjective:   Patient known to our practice from outpatient. She is followed for proteinuria, hypertension and chronic kidney disease Over the past several months, she has had a progressive decline in her renal function. Serologies have been negative.  permcath placed this admission.   2nd HD was 8/23   Objective:  Vital signs in last 24 hours:  Temp:  [98.4 F (36.9 C)-98.8 F (37.1 C)] 98.5 F (36.9 C) (08/25 0542) Pulse Rate:  [87-96] 92 (08/25 0656) Resp:  [18] 18 (08/25 0542) BP: (148-174)/(52-64) 157/60 (08/25 0656) SpO2:  [99 %-100 %] 100 % (08/25 0656)  Weight change:  Filed Weights   11/22/16 1610 11/22/16 1950 11/23/16 1000  Weight: 54 kg (119 lb) 56.5 kg (124 lb 9 oz) 54.2 kg (119 lb 7.8 oz)    Intake/Output:    Intake/Output Summary (Last 24 hours) at 11/25/16 1145 Last data filed at 11/24/16 1731  Gross per 24 hour  Intake              240 ml  Output              300 ml  Net              -60 ml     Physical Exam: General: Thin, no acute distress   HEENT Anicteric, moist oral mucous membranes   Neck Supple   Pulm/lungs Lungs are clear to auscultation   CVS/Heart Regular rhythm, no rub or gallop   Abdomen:  Soft, nontender   Extremities: No edema   Neurologic: Alert, oriented   Skin: No acute rashes   Access: Rt IJ PC 8/22. Dr Gilda Crease       Basic Metabolic Panel:   Recent Labs Lab 11/22/16 0338 11/23/16 0335  NA 140 138  K 4.9 4.7  CL 111 105  CO2 22 26  GLUCOSE 104* 129*  BUN 67* 39*  CREATININE 4.32* 3.17*  CALCIUM 8.3* 8.3*     CBC:  Recent Labs Lab 11/22/16 0338 11/23/16 0335 11/25/16 0443  WBC 7.6  --  8.1  HGB 8.1* 7.9* 6.9*  HCT 23.8*  --  20.1*  MCV 92.3  --  93.0  PLT 294  --  220      Lab Results  Component Value Date   HEPBSAG Negative 11/23/2016   HEPBSAB Non Reactive 11/23/2016      Microbiology:  No results found for this or any previous  visit (from the past 240 hour(s)).  Coagulation Studies: No results for input(s): LABPROT, INR in the last 72 hours.  Urinalysis: No results for input(s): COLORURINE, LABSPEC, PHURINE, GLUCOSEU, HGBUR, BILIRUBINUR, KETONESUR, PROTEINUR, UROBILINOGEN, NITRITE, LEUKOCYTESUR in the last 72 hours.  Invalid input(s): APPERANCEUR    Imaging: [place] Ue Vein Mapping Left  Result Date: 11/24/2016 CLINICAL DATA:  Preoperative vascular survey. EXAM: [place] EXTREM UP VEIN MAPPING COMPARISON:  None. FINDINGS: LEFT ARTERIES Wrist Radial Artery: Size 1.73mm  Waveform Triphasic Wrist Ulnar Artery: Size 1.6mm  Waveform not recorded Prox. Forearm Radial Artery: Size 1.41mm  Waveform Triphasic Upper Arm Brachial Artery: Size 4.46mm  Waveform Triphasic LEFT VEINS Forearm Cephalic Vein: Prox 1.61mm  Depth 3.28mm Forearm Basilic Vein: Prox 1.71mm  Depth 2.70mm Upper Arm Cephalic Vein: Prox 3.75mm Distal 5.107mm Depth 6.57mm Upper Arm Basilic Vein: Prox 4.1mm Distal 4.34mm Depth 5.36mm Upper Arm Brachial Vein: Prox 7.66mm Distal 3.75mm Depth 8.58mm ADDITIONAL LEFT VEINS Axillary Vein:  7.51mm Subclavian Vein: Patient: Yes Respiratory Phasicity:  Present Internal Jugular Vein: Patent: Yes    Respiratory Phasicity: Present Branches > 2 mm: Branch in the left cephalic vein distal upper arm. Branch in the left basilic vein at the antecubital fossa. At least 3 branches in the left basilic vein upper arm. Four branches in the left brachial vein upper arm. IMPRESSION: Left upper extremity venous mapping as described. Electronically Signed   By: Richarda Overlie M.D.   On: 11/24/2016 21:00     Medications:   . sodium chloride     . atorvastatin  10 mg Oral Daily  . feeding supplement (NEPRO CARB STEADY)  237 mL Oral BID BM  . heparin  5,000 Units Subcutaneous Q8H  . insulin aspart  0-5 Units Subcutaneous QHS  . insulin aspart  0-9 Units Subcutaneous TID WC  . irbesartan  300 mg Oral QHS  . labetalol  100 mg Oral BID  . multivitamin  1 tablet Oral  QHS  . NIFEdipine  30 mg Oral Daily  . pantoprazole  40 mg Oral BID AC   acetaminophen **OR** acetaminophen, diphenhydrAMINE, hydrALAZINE, labetalol, ondansetron **OR** ondansetron (ZOFRAN) IV  Assessment/ Plan:  66 y.o. female with long-standing diabetes, left ventricular hypertrophy, severe hypertension and chronic kidney disease. She was Dx with left ICA stenosis in October 2017. A 2-D echo at that time showed severe LVH and stage I diastolic dysfunction. LV EF was 65-70%. Her hemoglobin A1c was 6.1%.  1. End-stage renal disease.  Chronic kidney disease is likely secondary to Diabetes. Other differential Dx includes FSGS, membranous nephropathy. Patient opted for no biopsy due to potential risks.  - HD started 11/22/2016 [place] Patient [place] county transport to get to dialysis. Search for unit closed to new address in [place].  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 Anemia of chronic kidney disease  -  Blood transfusion planned. Start ESA Therapy   4. Proteinuria - continue  ARB   5. Hyperkalemia - Dietary education provided by dietician  6. HTN - ARB started. BP control is better - current regimen of irbesartan, labetalol, nifedipine - increase nifedipine to 60    LOS: 4 [facility] 8/25/201811:45 AM  [facility] [place], [place] [phone]

## 2016-11-25 NOTE — Progress Notes (Signed)
Pre dialysis assessment 

## 2016-11-25 NOTE — Progress Notes (Signed)
Post dialysis assessment 

## 2016-11-25 NOTE — Progress Notes (Signed)
HD STARTED  

## 2016-11-25 NOTE — Progress Notes (Signed)
Hd completed 

## 2016-11-25 NOTE — Progress Notes (Signed)
[facility] - [place] at [facility]   PATIENT NAME: Brittany Bowman    MR#:  [id number]  DATE OF BIRTH:  Apr 24, 1950  SUBJECTIVE:   Pt. Here due to acute on chronic renal failure and now progressed to CKS Stage V and started on HD.  No acute complaints or events overnight. Going for HD today.  Noted to be anemic today with Hg. Of 6.9 and to get transfused today at HD.   REVIEW OF SYSTEMS:    Review of Systems  Constitutional: Negative for chills and fever.  HENT: Negative for congestion and tinnitus.   Eyes: Negative for blurred vision and double vision.  Respiratory: Negative for cough, shortness of breath and wheezing.   Cardiovascular: Negative for chest pain, orthopnea and PND.  Gastrointestinal: Negative for abdominal pain, diarrhea, nausea and vomiting.  Genitourinary: Negative for dysuria and hematuria.  Neurological: Negative for dizziness, sensory change and focal weakness.  All other systems reviewed and are negative.   Nutrition: Renal diet Tolerating Diet: yes Tolerating PT: Ambulatory  DRUG ALLERGIES:   Allergies  Allergen Reactions  . Aspirin   . Nsaids     VITALS:  Blood pressure (!) 154/71, pulse 85, temperature 97.8 F (36.6 C), temperature source Oral, resp. rate 18, height 5\' 8"  (1.727 m), weight 54.2 kg (119 lb 7.8 oz), SpO2 100 %.  PHYSICAL EXAMINATION:   Physical Exam  GENERAL:  66 y.o.-year-old patient lying in bed in no acute distress.  EYES: Pupils equal, round, reactive to light and accommodation. No scleral icterus. Extraocular muscles intact.  HEENT: Head atraumatic, normocephalic. Oropharynx and nasopharynx clear.  NECK:  Supple, no jugular venous distention. No thyroid enlargement, no tenderness.  LUNGS: Normal breath sounds bilaterally, no wheezing, rales, rhonchi. No use of accessory muscles of respiration.  CARDIOVASCULAR: S1, S2 normal. No murmurs, rubs, or gallops.  ABDOMEN: Soft, nontender, nondistended. Bowel  sounds present. No organomegaly or mass.  EXTREMITIES: No cyanosis, clubbing or edema b/l.    NEUROLOGIC: Cranial nerves II through XII are intact. No focal Motor or sensory deficits b/l.   PSYCHIATRIC: The patient is alert and oriented x 3.  SKIN: No obvious rash, lesion, or ulcer.   Right Chest wall Perm-cath in place.   LABORATORY PANEL:   CBC  Recent Labs Lab 11/25/16 0443  WBC 8.1  HGB 6.9*  HCT 20.1*  PLT 220   ------------------------------------------------------------------------------------------------------------------  Chemistries   Recent Labs Lab 11/23/16 0335  NA 138  K 4.7  CL 105  CO2 26  GLUCOSE 129*  BUN 39*  CREATININE 3.17*  CALCIUM 8.3*   ------------------------------------------------------------------------------------------------------------------  Cardiac Enzymes No results for input(s): TROPONINI in the last 168 hours. ------------------------------------------------------------------------------------------------------------------  RADIOLOGY:  [place] Ue Vein Mapping Left  Result Date: 11/24/2016 CLINICAL DATA:  Preoperative vascular survey. EXAM: [place] EXTREM UP VEIN MAPPING COMPARISON:  None. FINDINGS: LEFT ARTERIES Wrist Radial Artery: Size 1.30mm  Waveform Triphasic Wrist Ulnar Artery: Size 1.53mm  Waveform not recorded Prox. Forearm Radial Artery: Size 1.43mm  Waveform Triphasic Upper Arm Brachial Artery: Size 4.26mm  Waveform Triphasic LEFT VEINS Forearm Cephalic Vein: Prox 1.49mm  Depth 3.46mm Forearm Basilic Vein: Prox 1.68mm  Depth 2.36mm Upper Arm Cephalic Vein: Prox 3.35mm Distal 5.7mm Depth 6.8mm Upper Arm Basilic Vein: Prox 4.48mm Distal 4.78mm Depth 5.4mm Upper Arm Brachial Vein: Prox 7.54mm Distal 3.41mm Depth 8.78mm ADDITIONAL LEFT VEINS Axillary Vein:  7.2mm Subclavian Vein: Patient: Yes Respiratory Phasicity: Present Internal Jugular Vein: Patent: Yes    Respiratory Phasicity: Present  Branches > 2 mm: Branch in the left cephalic vein distal upper  arm. Branch in the left basilic vein at the antecubital fossa. At least 3 branches in the left basilic vein upper arm. Four branches in the left brachial vein upper arm. IMPRESSION: Left upper extremity venous mapping as described. Electronically Signed   By: Richarda Overlie M.D.   On: 11/24/2016 21:00     ASSESSMENT AND PLAN:   66 year old female with past medical history of diabetes, essential hypertension, anemia of chronic disease secondary to CKD who presented to the hospital due to worsening renal function and progression of her renal failure.  1. Acute on chronic renal failure-patient has progressive CKD stage V and has been started on hemodialysis. -The cause of patient's renal failure is likely secondary to diabetic nephropathy. -Appreciate nephrology input, continue hemodialysis on a Tuesday Thursday Saturday schedule. Patient is moving to [facility] and therefore wants to get hemodialysis as an outpatient in [facility]. We'll plan to arrange outpatient spot for hemodialysis next week in [facility].  2. Essential hypertension-continue labetalol, nifedipine, Avapro.  - cont. PRN labetalol and Hydralazine.   3. Anemia of chronic disease-secondary to chronic kidney disease. Hemoglobin down to 6.9 today. We'll transfuse 1 unit of packed red blood cells hemodialysis. Follow hemoglobin.  4. Diabetes type 2 without, dictation-continue sliding scale insulin for now.  5. Hyperlipidemia-continue atorvastatin.  6. GERD - cont. Protonix.      All the records are reviewed and case discussed with Care Management/Social Worker. Management plans discussed with the patient, family and they are in agreement.  CODE STATUS: Full code  DVT Prophylaxis: Hep. SQ  TOTAL TIME TAKING CARE OF THIS PATIENT: 30 minutes.   POSSIBLE D/C IN 1-2 DAYS, DEPENDING ON CLINICAL CONDITION.   Houston Siren M.D on 11/25/2016 at 2:57 PM  Between 7am to 6pm - Pager - [phone]  After 6pm go to  [email or web address] - Social research officer, government  [facility]  Hospitalists  Office  [phone]  CC: Primary care physician; Velia Meyer, MD

## 2016-11-26 LAB — BPAM RBC
Blood Product Expiration Date: 201808282359
ISSUE DATE / TIME: 201808251528
Unit Type and Rh: 9500

## 2016-11-26 LAB — CBC
HCT: 24.9 % — ABNORMAL LOW (ref 35.0–47.0)
Hemoglobin: 8.7 g/dL — ABNORMAL LOW (ref 12.0–16.0)
MCH: 31.3 pg (ref 26.0–34.0)
MCHC: 35 g/dL (ref 32.0–36.0)
MCV: 89.4 fL (ref 80.0–100.0)
Platelets: 212 K/uL (ref 150–440)
RBC: 2.78 MIL/uL — ABNORMAL LOW (ref 3.80–5.20)
RDW: 14.6 % — ABNORMAL HIGH (ref 11.5–14.5)
WBC: 9.5 K/uL (ref 3.6–11.0)

## 2016-11-26 LAB — TYPE AND SCREEN
ABO/RH(D): A POS
Antibody Screen: NEGATIVE
UNIT DIVISION: 0

## 2016-11-26 LAB — GLUCOSE, CAPILLARY
GLUCOSE-CAPILLARY: 265 mg/dL — AB (ref 65–99)
Glucose-Capillary: 184 mg/dL — ABNORMAL HIGH (ref 65–99)
Glucose-Capillary: 190 mg/dL — ABNORMAL HIGH (ref 65–99)
Glucose-Capillary: 212 mg/dL — ABNORMAL HIGH (ref 65–99)

## 2016-11-26 NOTE — Progress Notes (Signed)
[facility] - [place] at [facility]   PATIENT NAME: Brittany Bowman    MR#:  [id number]  DATE OF BIRTH:  12/22/1950  SUBJECTIVE:   Pt. Here due to acute on chronic renal failure and now progressed to CKS Stage V and started on HD.  No acute complaints or events overnight. Globally improved post transfusion and currently at 8.7.  Eating breakfast and has no nausea today.  REVIEW OF SYSTEMS:    Review of Systems  Constitutional: Negative for chills and fever.  HENT: Negative for congestion and tinnitus.   Eyes: Negative for blurred vision and double vision.  Respiratory: Negative for cough, shortness of breath and wheezing.   Cardiovascular: Negative for chest pain, orthopnea and PND.  Gastrointestinal: Negative for abdominal pain, diarrhea, nausea and vomiting.  Genitourinary: Negative for dysuria and hematuria.  Neurological: Negative for dizziness, sensory change and focal weakness.  All other systems reviewed and are negative.   Nutrition: Renal diet Tolerating Diet: yes Tolerating PT: Ambulatory  DRUG ALLERGIES:   Allergies  Allergen Reactions  . Aspirin   . Nsaids     VITALS:  Blood pressure (!) 175/67, pulse 90, temperature 98.7 F (37.1 C), temperature source Oral, resp. rate 16, height 5\' 8"  (1.727 m), weight 54.2 kg (119 lb 7.8 oz), SpO2 98 %.  PHYSICAL EXAMINATION:   Physical Exam  GENERAL:  66 y.o.-year-old patient lying in bed in no acute distress.  EYES: Pupils equal, round, reactive to light and accommodation. No scleral icterus. Extraocular muscles intact.  HEENT: Head atraumatic, normocephalic. Oropharynx and nasopharynx clear.  NECK:  Supple, no jugular venous distention. No thyroid enlargement, no tenderness.  LUNGS: Normal breath sounds bilaterally, no wheezing, rales, rhonchi. No use of accessory muscles of respiration.  CARDIOVASCULAR: S1, S2 normal. No murmurs, rubs, or gallops.  ABDOMEN: Soft, nontender, nondistended. Bowel  sounds present. No organomegaly or mass.  EXTREMITIES: No cyanosis, clubbing or edema b/l.    NEUROLOGIC: Cranial nerves II through XII are intact. No focal Motor or sensory deficits b/l.   PSYCHIATRIC: The patient is alert and oriented x 3.  SKIN: No obvious rash, lesion, or ulcer.   Right Chest wall Perm-cath in place.   LABORATORY PANEL:   CBC  Recent Labs Lab 11/26/16 0500  WBC 9.5  HGB 8.7*  HCT 24.9*  PLT 212   ------------------------------------------------------------------------------------------------------------------  Chemistries   Recent Labs Lab 11/23/16 0335  NA 138  K 4.7  CL 105  CO2 26  GLUCOSE 129*  BUN 39*  CREATININE 3.17*  CALCIUM 8.3*   ------------------------------------------------------------------------------------------------------------------  Cardiac Enzymes No results for input(s): TROPONINI in the last 168 hours. ------------------------------------------------------------------------------------------------------------------  RADIOLOGY:  No results found.   ASSESSMENT AND PLAN:   66 year old female with past medical history of diabetes, essential hypertension, anemia of chronic disease secondary to CKD who presented to the hospital due to worsening renal function and progression of her renal failure.  1. Acute on chronic renal failure-patient has progressive CKD stage V and has been started on hemodialysis. -The cause of patient's renal failure is likely secondary to diabetic nephropathy. -Appreciate nephrology input, continue hemodialysis on a Tuesday Thursday Saturday schedule. Patient is moving to [facility] and therefore wants to get hemodialysis as an outpatient in [facility].  - await Nephro input regarding dialysis arrangement in [facility]  2. Essential hypertension-continue labetalol, nifedipine, Avapro.  - cont. PRN labetalol and Hydralazine.   3. Anemia of chronic disease-secondary to chronic kidney disease.  Hemoglobin improved post  transfusion to 8.7 and will cont. To monitor.   4. Diabetes type 2 without, dictation-continue sliding scale insulin for now.  5. Hyperlipidemia-continue atorvastatin.  6. GERD - cont. Protonix.      All the records are reviewed and case discussed with Care Management/Social Worker. Management plans discussed with the patient, family and they are in agreement.  CODE STATUS: Full code  DVT Prophylaxis: Hep. SQ  TOTAL TIME TAKING CARE OF THIS PATIENT: 25 minutes.   POSSIBLE D/C IN 1-2 DAYS, DEPENDING ON CLINICAL CONDITION.   Houston Siren M.D on 11/26/2016 at 12:00 PM  Between 7am to 6pm - Pager - [phone]  After 6pm go to [email or web address] - Social research officer, government  [facility] [place] Hospitalists  Office  [phone]  CC: Primary care physician; Velia Meyer, MD

## 2016-11-26 NOTE — Progress Notes (Signed)
[facility] [place], [place] 11/26/16  Subjective:   Patient known to our practice from outpatient. She is followed for proteinuria, hypertension and chronic kidney disease Over the past several months, she has had a progressive decline in her renal function. Serologies have been negative.  permcath placed this admission.   2nd HD was 8/23 Last HD on Saturday. Tolerated well   Objective:  Vital signs in last 24 hours:  Temp:  [97.8 F (36.6 C)-98.7 F (37.1 C)] 98.7 F (37.1 C) (08/26 0430) Pulse Rate:  [85-94] 90 (08/26 0940) Resp:  [16-20] 16 (08/26 0430) BP: (132-195)/(56-78) 175/67 (08/26 0940) SpO2:  [95 %-100 %] 98 % (08/26 0940)  Weight change:  Filed Weights   11/22/16 1610 11/22/16 1950 11/23/16 1000  Weight: 54 kg (119 lb) 56.5 kg (124 lb 9 oz) 54.2 kg (119 lb 7.8 oz)    Intake/Output:    Intake/Output Summary (Last 24 hours) at 11/26/16 1114 Last data filed at 11/26/16 0430  Gross per 24 hour  Intake              240 ml  Output             1000 ml  Net             -760 ml     Physical Exam: General: Thin, no acute distress   HEENT Anicteric, moist oral mucous membranes   Neck Supple   Pulm/lungs Lungs are clear to auscultation   CVS/Heart Regular rhythm, no rub or gallop   Abdomen:  Soft, nontender   Extremities: No edema   Neurologic: Alert, oriented   Skin: No acute rashes   Access: Rt IJ PC 8/22. Dr Gilda Crease       Basic Metabolic Panel:   Recent Labs Lab 11/22/16 0338 11/23/16 0335  NA 140 138  K 4.9 4.7  CL 111 105  CO2 22 26  GLUCOSE 104* 129*  BUN 67* 39*  CREATININE 4.32* 3.17*  CALCIUM 8.3* 8.3*     CBC:  Recent Labs Lab 11/22/16 0338 11/23/16 0335 11/25/16 0443 11/26/16 0500  WBC 7.6  --  8.1 9.5  HGB 8.1* 7.9* 6.9* 8.7*  HCT 23.8*  --  20.1* 24.9*  MCV 92.3  --  93.0 89.4  PLT 294  --  220 212      Lab Results  Component Value Date   HEPBSAG Negative 11/23/2016   HEPBSAB Non Reactive  11/23/2016      Microbiology:  No results found for this or any previous visit (from the past 240 hour(s)).  Coagulation Studies: No results for input(s): LABPROT, INR in the last 72 hours.  Urinalysis: No results for input(s): COLORURINE, LABSPEC, PHURINE, GLUCOSEU, HGBUR, BILIRUBINUR, KETONESUR, PROTEINUR, UROBILINOGEN, NITRITE, LEUKOCYTESUR in the last 72 hours.  Invalid input(s): APPERANCEUR    Imaging: No results found.   Medications:   . sodium chloride     . atorvastatin  10 mg Oral Daily  . epoetin (EPOGEN/PROCRIT) injection  4,000 Units Intravenous Q T,Th,Sa-HD  . feeding supplement (NEPRO CARB STEADY)  237 mL Oral BID BM  . heparin  5,000 Units Subcutaneous Q8H  . insulin aspart  0-5 Units Subcutaneous QHS  . insulin aspart  0-9 Units Subcutaneous TID WC  . irbesartan  300 mg Oral QHS  . labetalol  100 mg Oral BID  . multivitamin  1 tablet Oral QHS  . NIFEdipine  60 mg Oral QHS  . pantoprazole  40 mg Oral  BID AC   acetaminophen **OR** acetaminophen, diphenhydrAMINE, hydrALAZINE, labetalol, ondansetron **OR** ondansetron (ZOFRAN) IV  Assessment/ Plan:  66 y.o. female with long-standing diabetes, left ventricular hypertrophy, severe hypertension and chronic kidney disease. She was Dx with left ICA stenosis in October 2017. A 2-D echo at that time showed severe LVH and stage I diastolic dysfunction. LV EF was 65-70%. Her hemoglobin A1c was 6.1%.  1. End-stage renal disease.  Chronic kidney disease is likely secondary to Diabetes. Other differential Dx includes FSGS, membranous nephropathy. Patient opted for no biopsy due to potential risks.  - HD started 11/22/2016 [place] to [place]. Patient requires county transport to get to dialysis. Search for unit closed to new address in [place].  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 Anemia of  chronic kidney disease  -  Blood transfusion planned. Start ESA Therapy with HD  4. Proteinuria - continue  ARB  - UPC 1.5 gm now.  5. Hyperkalemia - Dietary education provided by dietician  6. HTN - ARB started.  - current regimen of irbesartan, labetalol, nifedipine - increase nifedipine to 60 QHS    LOS: 5 [facility] 8/26/201811:14 AM  [facility] [place], [place] [phone]

## 2016-11-27 LAB — GLUCOSE, CAPILLARY
GLUCOSE-CAPILLARY: 194 mg/dL — AB (ref 65–99)
GLUCOSE-CAPILLARY: 186 mg/dL — AB (ref 65–99)
Glucose-Capillary: 233 mg/dL — ABNORMAL HIGH (ref 65–99)
Glucose-Capillary: 187 mg/dL — ABNORMAL HIGH (ref 65–99)
Glucose-Capillary: 158 mg/dL — ABNORMAL HIGH (ref 65–99)

## 2016-11-27 LAB — HEMOGLOBIN: Hemoglobin: 8.8 g/dL — ABNORMAL LOW (ref 12.0–16.0)

## 2016-11-27 MED ORDER — INSULIN DETEMIR 100 UNIT/ML ~~LOC~~ SOLN
5.0000 [IU] | Freq: Two times a day (BID) | SUBCUTANEOUS | Status: DC
  Administered 2016-11-27 – 2016-12-01 (×7): 5 [IU] via SUBCUTANEOUS
  Filled 2016-11-27 (×9): qty 0.05

## 2016-11-27 NOTE — Progress Notes (Addendum)
Inpatient Diabetes Program Recommendations  AACE/ADA: New Consensus Statement on Inpatient Glycemic Control (2015)  Target Ranges:  Prepandial:   less than 140 mg/dL      Peak postprandial:   less than 180 mg/dL (1-2 hours)      Critically ill patients:  140 - 180 mg/dL   Results for KITT, COURSE (MRN [id number]) as of 11/27/2016 10:11  Ref. Range 11/26/2016 07:35 11/26/2016 11:56 11/26/2016 17:03 11/26/2016 21:23  Glucose-Capillary Latest Ref Range: 65 - 99 mg/dL 865 (H) 784 (H) 696 (H) 212 (H)   Results for KATIEANN, KELTY (MRN [id number]) as of 11/27/2016 10:11  Ref. Range 11/27/2016 07:47  Glucose-Capillary Latest Ref Range: 65 - 99 mg/dL 440 (H)    Admit with: Worsening Renal Function/ New Start to Hemodialysis  History: DM, CKD  Home DM Meds: NPH Insulin 10 units BID  Current Insulin Orders: Novolog Sensitive Correction Scale/ SSI (0-9 units) TID AC + HS      MD- Note patient takes NPH Insulin at home.  Please consider starting a portion of pt's home dose of basal insulin here in hospital.  Will have to substitute Levemir for NPH as NPH Insulin not on formulary.  Recommend Levemir 5 units BID to start (50% total home dose)     --Will follow patient during hospitalization--  Ambrose Finland RN, MSN, CDE Diabetes Coordinator Inpatient Glycemic Control Team Team Pager: [phone] (8a-5p)

## 2016-11-27 NOTE — Care Management Note (Signed)
Case Management Note  Patient Details  Name: Brittany Bowman MRN: [id number] Date of Birth: 12-May-1950  Subjective/Objective:  TC to Dimas Chyle with [facility] [employer] to inquire on outpatient dialysis set up. Waiting on return call.                   Action/Plan:   Expected Discharge Date:                  Expected Discharge Plan:     In-House Referral:     Discharge planning Services     Post Acute Care Choice:    Choice offered to:     DME Arranged:    DME Agency:     HH Arranged:    HH Agency:     Status of Service:     If discussed at [employer] of Stay Meetings, dates discussed:    Additional Comments:  Marily Memos, RN 11/27/2016, 2:03 PM

## 2016-11-27 NOTE — Progress Notes (Signed)
Initial Nutrition Assessment  DOCUMENTATION CODES:   Non-severe (moderate) malnutrition in context of chronic illness  INTERVENTION:   Nepro Shake po BID, each supplement provides 425 kcal and 19 grams protein  MVI  NUTRITION DIAGNOSIS:   Malnutrition (moderate) related to chronic illness (ESRD, DM) as evidenced by moderate depletion of body fat, moderate depletions of muscle mass.  GOAL:   Patient will meet greater than or equal to 90% of their needs  MONITOR:   PO intake, Supplement acceptance, Labs, Weight trends, I & O's  ASSESSMENT:   66 y.o. female with a known history of diabetes, hypertension, GERD, hyperlipidemia, chronic kidney disease stage IV who presents to the hospital due to worsening renal failure and function likely progressing to ESRD.    Pt continues to do well; eating 75-100% and drinking Nepro. HD initiated 8/22; pt tolerating well. Pt remains weight stable.   Medications reviewed and include: epoetin, heparin, insulin, MVI, protonix  Labs reviewed: Hgb 8.8(L), Hct 24.9(L)  Diet Order:  Diet renal/carb modified with fluid restriction Diet-HS Snack? Nothing; Room service appropriate? Yes; Fluid consistency: Thin  Skin:  Reviewed, no issues  Last BM:  8/26  Height:   Ht Readings from Last 1 Encounters:  11/22/16 5\' 8"  (1.727 m)    Weight:   Wt Readings from Last 1 Encounters:  11/23/16 119 lb 7.8 oz (54.2 kg)    Ideal Body Weight:  63.6 kg  BMI:  Body mass index is 18.17 kg/m.  Estimated Nutritional Needs:   Kcal:  1600-1900kcal/day   Protein:  86-97g/day   Fluid:  >1.6L/day   EDUCATION NEEDS:   Education needs addressed  Betsey Holiday MS, RD, LDN Pager #[phone] After Hours Pager: [phone]

## 2016-11-27 NOTE — Progress Notes (Signed)
[facility] [place], [place] 11/27/16  Subjective:  Patient resting at bedside. Dialysis was started on 11/22/2016. Outpatient dialysis placement still pending at the moment.  Objective:  Vital signs in last 24 hours:  Temp:  [98.6 F (37 C)-98.7 F (37.1 C)] 98.7 F (37.1 C) (08/27 0503) Pulse Rate:  [87-94] 94 (08/27 0503) Resp:  [16-20] 16 (08/27 0503) BP: (104-152)/(55-68) 128/55 (08/27 0503) SpO2:  [98 %-99 %] 98 % (08/27 0503)  Weight change:  Filed Weights   11/22/16 1610 11/22/16 1950 11/23/16 1000  Weight: 54 kg (119 lb) 56.5 kg (124 lb 9 oz) 54.2 kg (119 lb 7.8 oz)    Intake/Output:    Intake/Output Summary (Last 24 hours) at 11/27/16 1125 Last data filed at 11/27/16 1022  Gross per 24 hour  Intake              598 ml  Output             1050 ml  Net             -452 ml     Physical Exam: General: Thin, no acute distress   HEENT Anicteric, moist oral mucous membranes   Neck Supple   Pulm/lungs Lungs are clear to auscultation   CVS/Heart Regular rhythm, no rub or gallop   Abdomen:  Soft, nontender   Extremities: No edema   Neurologic: Alert, oriented, Follows commands   Skin: No acute rashes   Access: Rt IJ PC 8/22. Dr Gilda Crease       Basic Metabolic Panel:   Recent Labs Lab 11/22/16 0338 11/23/16 0335  NA 140 138  K 4.9 4.7  CL 111 105  CO2 22 26  GLUCOSE 104* 129*  BUN 67* 39*  CREATININE 4.32* 3.17*  CALCIUM 8.3* 8.3*     CBC:  Recent Labs Lab 11/22/16 0338 11/23/16 0335 11/25/16 0443 11/26/16 0500 11/27/16 0453  WBC 7.6  --  8.1 9.5  --   HGB 8.1* 7.9* 6.9* 8.7* 8.8*  HCT 23.8*  --  20.1* 24.9*  --   MCV 92.3  --  93.0 89.4  --   PLT 294  --  220 212  --       Lab Results  Component Value Date   HEPBSAG Negative 11/23/2016   HEPBSAB Non Reactive 11/23/2016      Microbiology:  No results found for this or any previous visit (from the past 240 hour(s)).  Coagulation Studies: No results for  input(s): LABPROT, INR in the last 72 hours.  Urinalysis: No results for input(s): COLORURINE, LABSPEC, PHURINE, GLUCOSEU, HGBUR, BILIRUBINUR, KETONESUR, PROTEINUR, UROBILINOGEN, NITRITE, LEUKOCYTESUR in the last 72 hours.  Invalid input(s): APPERANCEUR    Imaging: No results found.   Medications:   . sodium chloride Stopped (11/27/16 0757)   . atorvastatin  10 mg Oral Daily  . epoetin (EPOGEN/PROCRIT) injection  4,000 Units Intravenous Q T,Th,Sa-HD  . feeding supplement (NEPRO CARB STEADY)  237 mL Oral BID BM  . heparin  5,000 Units Subcutaneous Q8H  . insulin aspart  0-5 Units Subcutaneous QHS  . insulin aspart  0-9 Units Subcutaneous TID WC  . irbesartan  300 mg Oral QHS  . labetalol  100 mg Oral BID  . multivitamin  1 tablet Oral QHS  . NIFEdipine  60 mg Oral QHS  . pantoprazole  40 mg Oral BID AC   acetaminophen **OR** acetaminophen, diphenhydrAMINE, hydrALAZINE, labetalol, ondansetron **OR** ondansetron (ZOFRAN) IV  Assessment/ Plan:  66  y.o. female with long-standing diabetes, left ventricular hypertrophy, severe hypertension and chronic kidney disease. She was Dx with left ICA stenosis in October 2017. A 2-D echo at that time showed severe LVH and stage I diastolic dysfunction. LV EF was 65-70%. Her hemoglobin A1c was 6.1%.  1. End-stage renal disease.  Chronic kidney disease is likely secondary to Diabetes. Other differential Dx includes FSGS, membranous nephropathy. Patient opted for no biopsy due to potential risks.  - HD started 11/22/2016 Outpatient dialysis placement pending for [place].  Patient due for dialysis again tomorrow.  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 Anemia of chronic kidney disease  -  Hemoglobin 8.8. Continue Epogen 4000 units with dialysis.  4. Proteinuria - Continue irbesartan.   5. Hyperkalemia - Improved with dialysis. Continue to monitor as an outpatient.  6. HTN - Blood pressure  currently 128/55. Continue irbesartan, labetalol, and nifedipine.    LOS: 6 Sultan Pargas 8/27/201811:25 AM  [facility] [place], [place] [phone]

## 2016-11-27 NOTE — Progress Notes (Signed)
[facility] - [place] at [facility]   PATIENT NAME: Brittany Bowman    MR#:  [id number]  DATE OF BIRTH:  1950-11-27  SUBJECTIVE:   Pt. Here due to acute on chronic renal failure and now progressed to CKD Stage V and started on HD.  No acute complaints or events overnight. No further Nausea. Hg. Stable.    REVIEW OF SYSTEMS:    Review of Systems  Constitutional: Negative for chills and fever.  HENT: Negative for congestion and tinnitus.   Eyes: Negative for blurred vision and double vision.  Respiratory: Negative for cough, shortness of breath and wheezing.   Cardiovascular: Negative for chest pain, orthopnea and PND.  Gastrointestinal: Negative for abdominal pain, diarrhea, nausea and vomiting.  Genitourinary: Negative for dysuria and hematuria.  Neurological: Negative for dizziness, sensory change and focal weakness.  All other systems reviewed and are negative.   Nutrition: Renal diet Tolerating Diet: yes Tolerating PT: Ambulatory  DRUG ALLERGIES:   Allergies  Allergen Reactions  . Aspirin   . Nsaids     VITALS:  Blood pressure 140/61, pulse 89, temperature 98.6 F (37 C), resp. rate 16, height 5\' 8"  (1.727 m), weight 54.2 kg (119 lb 7.8 oz), SpO2 94 %.  PHYSICAL EXAMINATION:   Physical Exam  GENERAL:  66 y.o.-year-old patient lying in bed in no acute distress.  EYES: Pupils equal, round, reactive to light and accommodation. No scleral icterus. Extraocular muscles intact.  HEENT: Head atraumatic, normocephalic. Oropharynx and nasopharynx clear.  NECK:  Supple, no jugular venous distention. No thyroid enlargement, no tenderness.  LUNGS: Normal breath sounds bilaterally, no wheezing, rales, rhonchi. No use of accessory muscles of respiration.  CARDIOVASCULAR: S1, S2 normal. No murmurs, rubs, or gallops.  ABDOMEN: Soft, nontender, nondistended. Bowel sounds present. No organomegaly or mass.  EXTREMITIES: No cyanosis, clubbing or edema b/l.     NEUROLOGIC: Cranial nerves II through XII are intact. No focal Motor or sensory deficits b/l.   PSYCHIATRIC: The patient is alert and oriented x 3.  SKIN: No obvious rash, lesion, or ulcer.   Right Chest wall Perm-cath in place.   LABORATORY PANEL:   CBC  Recent Labs Lab 11/26/16 0500 11/27/16 0453  WBC 9.5  --   HGB 8.7* 8.8*  HCT 24.9*  --   PLT 212  --    ------------------------------------------------------------------------------------------------------------------  Chemistries   Recent Labs Lab 11/23/16 0335  NA 138  K 4.7  CL 105  CO2 26  GLUCOSE 129*  BUN 39*  CREATININE 3.17*  CALCIUM 8.3*   ------------------------------------------------------------------------------------------------------------------  Cardiac Enzymes No results for input(s): TROPONINI in the last 168 hours. ------------------------------------------------------------------------------------------------------------------  RADIOLOGY:  No results found.   ASSESSMENT AND PLAN:   66 year old female with past medical history of diabetes, essential hypertension, anemia of chronic disease secondary to CKD who presented to the hospital due to worsening renal function and progression of her renal failure.  1. Acute on chronic renal failure-patient has progressive CKD stage V and has been started on hemodialysis. -The cause of patient's renal failure is likely secondary to diabetic nephropathy. -Appreciate nephrology input, continue hemodialysis on a Tuesday Thursday Saturday schedule. Patient is moving to [facility] and therefore wants to get hemodialysis as an outpatient in [facility].  - nephrology and social work planning to arrange outpatient spot in [facility].  2. Essential hypertension-continue labetalol, nifedipine, Avapro.  - cont. PRN labetalol and Hydralazine.   3. Anemia of chronic disease-secondary to chronic kidney disease. Hemoglobin improved  post transfusion and  remains stable at 8.8.  -  cont. To monitor.   4. Diabetes type 2 without complication-continue sliding scale insulin for now. - BS Stable.   5. Hyperlipidemia-continue atorvastatin.  6. GERD - cont. Protonix.    All the records are reviewed and case discussed with Care Management/Social Worker. Management plans discussed with the patient, family and they are in agreement.  CODE STATUS: Full code  DVT Prophylaxis: Hep. SQ  TOTAL TIME TAKING CARE OF THIS PATIENT: 25 minutes.   POSSIBLE D/C IN 1-2 DAYS, DEPENDING ON CLINICAL CONDITION.   Houston Siren M.D on 11/27/2016 at 3:17 PM  Between 7am to 6pm - Pager - [phone]  After 6pm go to [email or web address] - Social research officer, government  [facility] [place] [facility]  Office  [phone]  CC: Primary care physician; Velia Meyer, MD

## 2016-11-28 LAB — GLUCOSE, CAPILLARY
GLUCOSE-CAPILLARY: 146 mg/dL — AB (ref 65–99)
Glucose-Capillary: 135 mg/dL — ABNORMAL HIGH (ref 65–99)
Glucose-Capillary: 210 mg/dL — ABNORMAL HIGH (ref 65–99)

## 2016-11-28 LAB — BASIC METABOLIC PANEL
ANION GAP: 9 (ref 5–15)
BUN: 68 mg/dL — AB (ref 6–20)
CHLORIDE: 97 mmol/L — AB (ref 101–111)
CO2: 29 mmol/L (ref 22–32)
Calcium: 8.5 mg/dL — ABNORMAL LOW (ref 8.9–10.3)
Creatinine, Ser: 4.69 mg/dL — ABNORMAL HIGH (ref 0.44–1.00)
GFR calc Af Amer: 10 mL/min — ABNORMAL LOW (ref >60)
GFR, EST NON AFRICAN AMERICAN: 9 mL/min — AB (ref >60)
GLUCOSE: 161 mg/dL — AB (ref 65–99)
POTASSIUM: 5.2 mmol/L — AB (ref 3.5–5.1)
Sodium: 135 mmol/L (ref 135–145)

## 2016-11-28 LAB — PHOSPHORUS: Phosphorus: 4.9 mg/dL — ABNORMAL HIGH (ref 2.5–4.6)

## 2016-11-28 NOTE — Progress Notes (Signed)
HD initiated without issue via R Chest hd catheter. Dressing changed. Patient currently has no complaints.

## 2016-11-28 NOTE — Progress Notes (Signed)
[facility] [place], [place] 11/28/16  Subjective:  Patient seen and evaluated during dialysis. She appears to be tolerating well. Outpatient dialysis placement still pending.  Objective:  Vital signs in last 24 hours:  Temp:  [98.2 F (36.8 C)-98.6 F (37 C)] 98.2 F (36.8 C) (08/28 0950) Pulse Rate:  [85-91] 91 (08/28 1130) Resp:  [15-20] 19 (08/28 1130) BP: (118-166)/(47-74) 144/65 (08/28 1130) SpO2:  [94 %-100 %] 100 % (08/28 0950) Weight:  [56.2 kg (123 lb 14.4 oz)] 56.2 kg (123 lb 14.4 oz) (08/28 0950)  Weight change:  Filed Weights   11/22/16 1950 11/23/16 1000 11/28/16 0950  Weight: 56.5 kg (124 lb 9 oz) 54.2 kg (119 lb 7.8 oz) 56.2 kg (123 lb 14.4 oz)    Intake/Output:    Intake/Output Summary (Last 24 hours) at 11/28/16 1159 Last data filed at 11/28/16 1012  Gross per 24 hour  Intake              180 ml  Output              600 ml  Net             -420 ml     Physical Exam: General: Thin, no acute distress   HEENT Anicteric, moist oral mucous membranes   Neck Supple   Pulm/lungs Lungs are clear to auscultation   CVS/Heart Regular rhythm, no rub or gallop   Abdomen:  Soft, nontender   Extremities: No edema   Neurologic: Alert, oriented, Follows commands   Skin: No acute rashes   Access: Rt IJ PC 8/22. Dr Gilda Crease       Basic Metabolic Panel:   Recent Labs Lab 11/22/16 0338 11/23/16 0335 11/28/16 0424  NA 140 138 135  K 4.9 4.7 5.2*  CL 111 105 97*  CO2 22 26 29   GLUCOSE 104* 129* 161*  BUN 67* 39* 68*  CREATININE 4.32* 3.17* 4.69*  CALCIUM 8.3* 8.3* 8.5*     CBC:  Recent Labs Lab 11/22/16 0338 11/23/16 0335 11/25/16 0443 11/26/16 0500 11/27/16 0453  WBC 7.6  --  8.1 9.5  --   HGB 8.1* 7.9* 6.9* 8.7* 8.8*  HCT 23.8*  --  20.1* 24.9*  --   MCV 92.3  --  93.0 89.4  --   PLT 294  --  220 212  --       Lab Results  Component Value Date   HEPBSAG Negative 11/23/2016   HEPBSAB Non Reactive 11/23/2016       Microbiology:  No results found for this or any previous visit (from the past 240 hour(s)).  Coagulation Studies: No results for input(s): LABPROT, INR in the last 72 hours.  Urinalysis: No results for input(s): COLORURINE, LABSPEC, PHURINE, GLUCOSEU, HGBUR, BILIRUBINUR, KETONESUR, PROTEINUR, UROBILINOGEN, NITRITE, LEUKOCYTESUR in the last 72 hours.  Invalid input(s): APPERANCEUR    Imaging: No results found.   Medications:   . sodium chloride Stopped (11/27/16 0757)   . atorvastatin  10 mg Oral Daily  . epoetin (EPOGEN/PROCRIT) injection  4,000 Units Intravenous Q T,Th,Sa-HD  . feeding supplement (NEPRO CARB STEADY)  237 mL Oral BID BM  . heparin  5,000 Units Subcutaneous Q8H  . insulin aspart  0-5 Units Subcutaneous QHS  . insulin aspart  0-9 Units Subcutaneous TID WC  . insulin detemir  5 Units Subcutaneous BID  . irbesartan  300 mg Oral QHS  . labetalol  100 mg Oral BID  . multivitamin  1  tablet Oral QHS  . NIFEdipine  60 mg Oral QHS  . pantoprazole  40 mg Oral BID AC   acetaminophen **OR** acetaminophen, diphenhydrAMINE, hydrALAZINE, labetalol, ondansetron **OR** ondansetron (ZOFRAN) IV  Assessment/ Plan:  66 y.o. female with long-standing diabetes, left ventricular hypertrophy, severe hypertension and chronic kidney disease. She was Dx with left ICA stenosis in October 2017. A 2-D echo at that time showed severe LVH and stage I diastolic dysfunction. LV EF was 65-70%. Her hemoglobin A1c was 6.1%.  1. End-stage renal disease.  Chronic kidney disease is likely secondary to Diabetes. Other differential Dx includes FSGS, membranous nephropathy. Patient opted for no biopsy due to potential risks.  - HD started 11/22/2016 -  Patient seen and evaluated during dialysis treatment today. She appears to be tolerating well. We plan to complete the treatment today. Outpatient dialysis placement being coordinated.  2. Diabetes type 2 with chronic kidney disease  -  Hemoglobin A1c 6% on 08/18/2016   3. Anemia of chronic kidney disease  -  Continue Epogen 4000 units with dialysis today.  4. Proteinuria - Continue irbesartan.   5. Hyperkalemia - Potassium slightly high today at 5.2. We will need to continue to monitor. This should improve with dialysis today.  6. HTN - Blood pressure currently 144/65.  We will maintain the patient on irbesartan, labetalol, and nifedipine.    LOS: 7 Hermena Swint 8/28/201811:59 AM  [address] [place], [place] [phone]

## 2016-11-28 NOTE — Care Management (Signed)
Case discussed with Marchelle Folks with [employer]. Waiting on insurance approval. Should have a answer today.

## 2016-11-28 NOTE — Progress Notes (Signed)
[facility] - [place] at [facility]   PATIENT NAME: Brittany Bowman    MR#:  [id number]  DATE OF BIRTH:  21-May-1950  SUBJECTIVE:   Pt. Here due to acute on chronic renal failure and now progressed to CKD Stage V and started on HD.  Seen after HD today.  No complaints. No N/V.   REVIEW OF SYSTEMS:    Review of Systems  Constitutional: Negative for chills and fever.  HENT: Negative for congestion and tinnitus.   Eyes: Negative for blurred vision and double vision.  Respiratory: Negative for cough, shortness of breath and wheezing.   Cardiovascular: Negative for chest pain, orthopnea and PND.  Gastrointestinal: Negative for abdominal pain, diarrhea, nausea and vomiting.  Genitourinary: Negative for dysuria and hematuria.  Neurological: Negative for dizziness, sensory change and focal weakness.  All other systems reviewed and are negative.   Nutrition: Renal diet Tolerating Diet: yes Tolerating PT: Ambulatory  DRUG ALLERGIES:   Allergies  Allergen Reactions  . Aspirin   . Nsaids     VITALS:  Blood pressure (!) 144/61, pulse 92, temperature 98 F (36.7 C), temperature source Oral, resp. rate 19, height 5\' 8"  (1.727 m), weight 55.2 kg (121 lb 11.1 oz), SpO2 96 %.  PHYSICAL EXAMINATION:   Physical Exam  GENERAL:  66 y.o.-year-old patient lying in bed in no acute distress.  EYES: Pupils equal, round, reactive to light and accommodation. No scleral icterus. Extraocular muscles intact.  HEENT: Head atraumatic, normocephalic. Oropharynx and nasopharynx clear.  NECK:  Supple, no jugular venous distention. No thyroid enlargement, no tenderness.  LUNGS: Normal breath sounds bilaterally, no wheezing, rales, rhonchi. No use of accessory muscles of respiration.  CARDIOVASCULAR: S1, S2 normal. No murmurs, rubs, or gallops.  ABDOMEN: Soft, nontender, nondistended. Bowel sounds present. No organomegaly or mass.  EXTREMITIES: No cyanosis, clubbing or edema b/l.     NEUROLOGIC: Cranial nerves II through XII are intact. No focal Motor or sensory deficits b/l.   PSYCHIATRIC: The patient is alert and oriented x 3.  SKIN: No obvious rash, lesion, or ulcer.   Right Chest wall Perm-cath in place.   LABORATORY PANEL:   CBC  Recent Labs Lab 11/26/16 0500 11/27/16 0453  WBC 9.5  --   HGB 8.7* 8.8*  HCT 24.9*  --   PLT 212  --    ------------------------------------------------------------------------------------------------------------------  Chemistries   Recent Labs Lab 11/28/16 0424  NA 135  K 5.2*  CL 97*  CO2 29  GLUCOSE 161*  BUN 68*  CREATININE 4.69*  CALCIUM 8.5*   ------------------------------------------------------------------------------------------------------------------  Cardiac Enzymes No results for input(s): TROPONINI in the last 168 hours. ------------------------------------------------------------------------------------------------------------------  RADIOLOGY:  No results found.   ASSESSMENT AND PLAN:   66 year old female with past medical history of diabetes, essential hypertension, anemia of chronic disease secondary to CKD who presented to the hospital due to worsening renal function and progression of her renal failure.  1. Acute on chronic renal failure-patient has progressive CKD stage V and has been started on hemodialysis. -The cause of patient's renal failure is likely secondary to diabetic nephropathy. -Appreciate nephrology input, continue hemodialysis on a Tuesday Thursday Saturday schedule. Patient is moving to [facility] and therefore wants to get hemodialysis as an outpatient in [facility].  - nephrology and social work planning to arrange outpatient spot in [facility].  2. Essential hypertension-continue labetalol, nifedipine, Avapro.  - cont. PRN labetalol and Hydralazine. BP stable.    3. Anemia of chronic disease-secondary to chronic  kidney disease. Hemoglobin improved post  transfusion and remains stable at 8.8 as of yesterday.  No acute bleeding.   4. Diabetes type 2 without complication-continue sliding scale insulin for now. - BS Stable.   5. Hyperlipidemia-continue atorvastatin.  6. GERD - cont. Protonix.   Possible discharge soon once outpatient dialysis spot arranged.   All the records are reviewed and case discussed with Care Management/Social Worker. Management plans discussed with the patient, family and they are in agreement.  CODE STATUS: Full code  DVT Prophylaxis: Hep. SQ  TOTAL TIME TAKING CARE OF THIS PATIENT: 25 minutes.   POSSIBLE D/C IN 1-2 DAYS, DEPENDING ON CLINICAL CONDITION.   Houston Siren M.D on 11/28/2016 at 1:58 PM  Between 7am to 6pm - Pager - [phone]  After 6pm go to [email or web address] - Social research officer, government  [facility]  Office  [phone]  CC: Primary care physician; Velia Meyer, MD

## 2016-11-28 NOTE — Progress Notes (Signed)
Pre hd 

## 2016-11-28 NOTE — Progress Notes (Signed)
Post hd assessment unchanged  

## 2016-11-28 NOTE — Progress Notes (Signed)
Pre hd assessment 0950am. Brittany Bowman

## 2016-11-29 LAB — GLUCOSE, CAPILLARY
GLUCOSE-CAPILLARY: 224 mg/dL — AB (ref 65–99)
GLUCOSE-CAPILLARY: 192 mg/dL — AB (ref 65–99)
Glucose-Capillary: 165 mg/dL — ABNORMAL HIGH (ref 65–99)
Glucose-Capillary: 175 mg/dL — ABNORMAL HIGH (ref 65–99)

## 2016-11-29 NOTE — Progress Notes (Signed)
[facility] [place], [place] 11/29/16  Subjective:  Patient seen at bedside. Had dialysis yesterday. Outpatient dialysis placement still pending.  Objective:  Vital signs in last 24 hours:  Temp:  [98 F (36.7 C)-98.4 F (36.9 C)] 98.2 F (36.8 C) (08/29 0512) Pulse Rate:  [83-93] 83 (08/29 0512) Resp:  [15-19] 16 (08/29 0512) BP: (121-144)/(51-74) 121/51 (08/29 0512) SpO2:  [96 %-99 %] 99 % (08/29 0512) Weight:  [55.2 kg (121 lb 11.1 oz)] 55.2 kg (121 lb 11.1 oz) (08/28 1253)  Weight change:  Filed Weights   11/23/16 1000 11/28/16 0950 11/28/16 1253  Weight: 54.2 kg (119 lb 7.8 oz) 56.2 kg (123 lb 14.4 oz) 55.2 kg (121 lb 11.1 oz)    Intake/Output:    Intake/Output Summary (Last 24 hours) at 11/29/16 1027 Last data filed at 11/29/16 0500  Gross per 24 hour  Intake              240 ml  Output             2100 ml  Net            -1860 ml     Physical Exam: General: Thin, no acute distress   HEENT Anicteric, moist oral mucous membranes   Neck Supple   Pulm/lungs Lungs are clear to auscultation   CVS/Heart Regular rhythm, no rub or gallop   Abdomen:  Soft, nontender   Extremities: No edema   Neurologic: Alert, oriented, Follows commands   Skin: No acute rashes   Access: Rt IJ PC 8/22. Dr Gilda CreaseSchnier       Basic Metabolic Panel:   Recent Labs Lab 11/23/16 0335 11/28/16 0424  NA 138 135  K 4.7 5.2*  CL 105 97*  CO2 26 29  GLUCOSE 129* 161*  BUN 39* 68*  CREATININE 3.17* 4.69*  CALCIUM 8.3* 8.5*  PHOS  --  4.9*     CBC:  Recent Labs Lab 11/23/16 0335 11/25/16 0443 11/26/16 0500 11/27/16 0453  WBC  --  8.1 9.5  --   HGB 7.9* 6.9* 8.7* 8.8*  HCT  --  20.1* 24.9*  --   MCV  --  93.0 89.4  --   PLT  --  220 212  --       Lab Results  Component Value Date   HEPBSAG Negative 11/23/2016   HEPBSAB Non Reactive 11/23/2016      Microbiology:  No results found for this or any previous visit (from the past 240  hour(s)).  Coagulation Studies: No results for input(s): LABPROT, INR in the last 72 hours.  Urinalysis: No results for input(s): COLORURINE, LABSPEC, PHURINE, GLUCOSEU, HGBUR, BILIRUBINUR, KETONESUR, PROTEINUR, UROBILINOGEN, NITRITE, LEUKOCYTESUR in the last 72 hours.  Invalid input(s): APPERANCEUR    Imaging: No results found.   Medications:   . sodium chloride Stopped (11/27/16 0757)   . atorvastatin  10 mg Oral Daily  . epoetin (EPOGEN/PROCRIT) injection  4,000 Units Intravenous Q T,Th,Sa-HD  . feeding supplement (NEPRO CARB STEADY)  237 mL Oral BID BM  . heparin  5,000 Units Subcutaneous Q8H  . insulin aspart  0-5 Units Subcutaneous QHS  . insulin aspart  0-9 Units Subcutaneous TID WC  . insulin detemir  5 Units Subcutaneous BID  . irbesartan  300 mg Oral QHS  . labetalol  100 mg Oral BID  . multivitamin  1 tablet Oral QHS  . NIFEdipine  60 mg Oral QHS  . pantoprazole  40 mg Oral  BID AC   acetaminophen **OR** acetaminophen, diphenhydrAMINE, hydrALAZINE, labetalol, ondansetron **OR** ondansetron (ZOFRAN) IV  Assessment/ Plan:  66 y.o. female with long-standing diabetes, left ventricular hypertrophy, severe hypertension and chronic kidney disease. She was Dx with left ICA stenosis in October 2017. A 2-D echo at that time showed severe LVH and stage I diastolic dysfunction. LV EF was 65-70%. Her hemoglobin A1c was 6.1%.  1. End-stage renal disease.  Chronic kidney disease is likely secondary to Diabetes. Other differential Dx includes FSGS, membranous nephropathy. Patient opted for no biopsy due to potential risks.  - HD started 11/22/2016 -  Outpatient dialysis placement still pending. We will plan for dialysis tomorrow if the patient is still here.  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 Anemia of chronic kidney disease  -  We will maintain the patient on Epogen with dialysis.  4. Proteinuria - Continue irbesartan.    5. Hyperkalemia - Consider repeating serum electro lites tomorrow as most recent serum potassium was 5.2.  6. HTN - Blood pressure currently under good control at 121/51.  Continue the patient on irbesartan, labetalol, and nifedipine.    LOS: 8 Karra Pink 8/29/201810:27 AM  [address] [place], [place] [phone]

## 2016-11-29 NOTE — Progress Notes (Signed)
[facility] - [place] at [facility]   PATIENT NAME: Brittany Bowman    MR#:  [id number]  DATE OF BIRTH:  Dec 01, 1950  SUBJECTIVE:   Pt. Here due to acute on chronic renal failure and now progressed to CKD Stage V and started on HD. AWaiting outpatient spot for HD.  Clinically otherwise doing well.  No complaints.    REVIEW OF SYSTEMS:    Review of Systems  Constitutional: Negative for chills and fever.  HENT: Negative for congestion and tinnitus.   Eyes: Negative for blurred vision and double vision.  Respiratory: Negative for cough, shortness of breath and wheezing.   Cardiovascular: Negative for chest pain, orthopnea and PND.  Gastrointestinal: Negative for abdominal pain, diarrhea, nausea and vomiting.  Genitourinary: Negative for dysuria and hematuria.  Neurological: Negative for dizziness, sensory change and focal weakness.  All other systems reviewed and are negative.   Nutrition: Renal diet Tolerating Diet: yes Tolerating PT: Ambulatory  DRUG ALLERGIES:   Allergies  Allergen Reactions  . Aspirin   . Nsaids     VITALS:  Blood pressure (!) 121/51, pulse 83, temperature 98.2 F (36.8 C), temperature source Oral, resp. rate 16, height 5\' 8"  (1.727 m), weight 55.2 kg (121 lb 11.1 oz), SpO2 99 %.  PHYSICAL EXAMINATION:   Physical Exam  GENERAL:  66 y.o.-year-old patient lying in bed in no acute distress.  EYES: Pupils equal, round, reactive to light and accommodation. No scleral icterus. Extraocular muscles intact.  HEENT: Head atraumatic, normocephalic. Oropharynx and nasopharynx clear.  NECK:  Supple, no jugular venous distention. No thyroid enlargement, no tenderness.  LUNGS: Normal breath sounds bilaterally, no wheezing, rales, rhonchi. No use of accessory muscles of respiration.  CARDIOVASCULAR: S1, S2 normal. No murmurs, rubs, or gallops.  ABDOMEN: Soft, nontender, nondistended. Bowel sounds present. No organomegaly or mass.  EXTREMITIES: No  cyanosis, clubbing or edema b/l.    NEUROLOGIC: Cranial nerves II through XII are intact. No focal Motor or sensory deficits b/l.   PSYCHIATRIC: The patient is alert and oriented x 3.  SKIN: No obvious rash, lesion, or ulcer.   Right Chest wall Perm-cath in place.   LABORATORY PANEL:   CBC  Recent Labs Lab 11/26/16 0500 11/27/16 0453  WBC 9.5  --   HGB 8.7* 8.8*  HCT 24.9*  --   PLT 212  --    ------------------------------------------------------------------------------------------------------------------  Chemistries   Recent Labs Lab 11/28/16 0424  NA 135  K 5.2*  CL 97*  CO2 29  GLUCOSE 161*  BUN 68*  CREATININE 4.69*  CALCIUM 8.5*   ------------------------------------------------------------------------------------------------------------------  Cardiac Enzymes No results for input(s): TROPONINI in the last 168 hours. ------------------------------------------------------------------------------------------------------------------  RADIOLOGY:  No results found.   ASSESSMENT AND PLAN:   66 year old female with past medical history of diabetes, essential hypertension, anemia of chronic disease secondary to CKD who presented to the hospital due to worsening renal function and progression of her renal failure.  1. Acute on chronic renal failure-patient has progressive CKD stage V and has been started on hemodialysis. -The cause of patient's renal failure is likely secondary to diabetic nephropathy. -Appreciate nephrology input, continue hemodialysis on a Tuesday Thursday Saturday schedule.  - [facility] spot [facility].  No complaints.   2. Essential hypertension-continue labetalol, nifedipine, Avapro.  - cont. PRN labetalol and Hydralazine. BP stable.    3. Anemia of chronic disease-secondary to chronic kidney disease. Hemoglobin improved post transfusion and remains stable at 8.8 as of  11/27/16. No acute bleeding.   4. Diabetes type  2 without complication-continue sliding scale insulin for now. - BS Stable.   5. Hyperlipidemia-continue atorvastatin.  6. GERD - cont. Protonix.   Possible discharge soon once outpatient dialysis spot arranged.   All the records are reviewed and case discussed with Care Management/Social Worker. Management plans discussed with the patient, family and they are in agreement.  CODE STATUS: Full code  DVT Prophylaxis: Hep. SQ  TOTAL TIME TAKING CARE OF THIS PATIENT: 25 minutes.   POSSIBLE D/C IN 1-2 DAYS, DEPENDING ON CLINICAL CONDITION.   Houston Siren M.D on 11/29/2016 at 10:38 AM  Between 7am to 6pm - Pager - [phone]  After 6pm go to [email or web address] - Social research officer, government  [facility] [place] [facility]  Office  [phone]  CC: Primary care physician; Velia Meyer, MD

## 2016-11-30 LAB — RENAL FUNCTION PANEL
ANION GAP: 10 (ref 5–15)
Albumin: 3.2 g/dL — ABNORMAL LOW (ref 3.5–5.0)
BUN: 66 mg/dL — ABNORMAL HIGH (ref 6–20)
CHLORIDE: 100 mmol/L — AB (ref 101–111)
CO2: 27 mmol/L (ref 22–32)
CREATININE: 4.25 mg/dL — AB (ref 0.44–1.00)
Calcium: 8.5 mg/dL — ABNORMAL LOW (ref 8.9–10.3)
GFR, EST AFRICAN AMERICAN: 12 mL/min — AB (ref >60)
GFR, EST NON AFRICAN AMERICAN: 10 mL/min — AB (ref >60)
Glucose, Bld: 129 mg/dL — ABNORMAL HIGH (ref 65–99)
POTASSIUM: 4.3 mmol/L (ref 3.5–5.1)
Phosphorus: 5 mg/dL — ABNORMAL HIGH (ref 2.5–4.6)
Sodium: 137 mmol/L (ref 135–145)

## 2016-11-30 LAB — GLUCOSE, CAPILLARY
GLUCOSE-CAPILLARY: 140 mg/dL — AB (ref 65–99)
GLUCOSE-CAPILLARY: 119 mg/dL — AB (ref 65–99)
GLUCOSE-CAPILLARY: 217 mg/dL — AB (ref 65–99)
GLUCOSE-CAPILLARY: 203 mg/dL — AB (ref 65–99)

## 2016-11-30 LAB — CBC
HEMATOCRIT: 24.2 % — AB (ref 35.0–47.0)
Hemoglobin: 8.2 g/dL — ABNORMAL LOW (ref 12.0–16.0)
MCH: 31.7 pg (ref 26.0–34.0)
MCHC: 34.1 g/dL (ref 32.0–36.0)
MCV: 93 fL (ref 80.0–100.0)
Platelets: 239 10*3/uL (ref 150–440)
RBC: 2.6 MIL/uL — ABNORMAL LOW (ref 3.80–5.20)
RDW: 14.4 % (ref 11.5–14.5)
WBC: 9 10*3/uL (ref 3.6–11.0)

## 2016-11-30 NOTE — Care Management (Signed)
Notified by Dimas ChyleAmanda Morris that patient HD outpatient arrangements are as follows [facility].  First treatment will be next Tuesday at 10:45.  Per Marchelle FolksAmanda plan to dialyze tomorrow 8/31, discharge, then start outpatient HD 12/03/16.

## 2016-11-30 NOTE — Progress Notes (Signed)
[facility] [place], [place] 11/30/16  Subjective:  Patient seen and evaluated during hemodialysis. Appears to be tolerating well. Outpt dialysis placement still pending.   Objective:  Vital signs in last 24 hours:  Temp:  [98 F (36.7 C)-98.3 F (36.8 C)] 98 F (36.7 C) (08/30 0855) Pulse Rate:  [83-90] 90 (08/30 1129) Resp:  [12-16] 16 (08/30 1129) BP: (129-144)/(52-72) 143/72 (08/30 1129) SpO2:  [97 %-99 %] 99 % (08/30 0855) Weight:  [56 kg (123 lb 7.3 oz)] 56 kg (123 lb 7.3 oz) (08/30 0855)  Weight change:  Filed Weights   11/28/16 0950 11/28/16 1253 11/30/16 0855  Weight: 56.2 kg (123 lb 14.4 oz) 55.2 kg (121 lb 11.1 oz) 56 kg (123 lb 7.3 oz)    Intake/Output:    Intake/Output Summary (Last 24 hours) at 11/30/16 1133 Last data filed at 11/30/16 0500  Gross per 24 hour  Intake              260 ml  Output             1000 ml  Net             -740 ml     Physical Exam: General: Thin, no acute distress   HEENT Anicteric, moist oral mucous membranes   Neck Supple   Pulm/lungs Lungs are clear to auscultation   CVS/Heart Regular rhythm, no rub or gallop   Abdomen:  Soft, nontender   Extremities: No edema   Neurologic: Alert, oriented, Follows commands   Skin: No acute rashes   Access: Rt IJ PC 8/22. Dr Gilda CreaseSchnier       Basic Metabolic Panel:   Recent Labs Lab 11/28/16 0424 11/30/16 0907  NA 135 137  K 5.2* 4.3  CL 97* 100*  CO2 29 27  GLUCOSE 161* 129*  BUN 68* 66*  CREATININE 4.69* 4.25*  CALCIUM 8.5* 8.5*  PHOS 4.9* 5.0*     CBC:  Recent Labs Lab 11/25/16 0443 11/26/16 0500 11/27/16 0453 11/30/16 0442  WBC 8.1 9.5  --  9.0  HGB 6.9* 8.7* 8.8* 8.2*  HCT 20.1* 24.9*  --  24.2*  MCV 93.0 89.4  --  93.0  PLT 220 212  --  239      Lab Results  Component Value Date   HEPBSAG Negative 11/23/2016   HEPBSAB Non Reactive 11/23/2016      Microbiology:  No results found for this or any previous visit (from the past  240 hour(s)).  Coagulation Studies: No results for input(s): LABPROT, INR in the last 72 hours.  Urinalysis: No results for input(s): COLORURINE, LABSPEC, PHURINE, GLUCOSEU, HGBUR, BILIRUBINUR, KETONESUR, PROTEINUR, UROBILINOGEN, NITRITE, LEUKOCYTESUR in the last 72 hours.  Invalid input(s): APPERANCEUR    Imaging: No results found.   Medications:   . sodium chloride Stopped (11/27/16 0757)   . atorvastatin  10 mg Oral Daily  . epoetin (EPOGEN/PROCRIT) injection  4,000 Units Intravenous Q T,Th,Sa-HD  . feeding supplement (NEPRO CARB STEADY)  237 mL Oral BID BM  . heparin  5,000 Units Subcutaneous Q8H  . insulin aspart  0-5 Units Subcutaneous QHS  . insulin aspart  0-9 Units Subcutaneous TID WC  . insulin detemir  5 Units Subcutaneous BID  . irbesartan  300 mg Oral QHS  . labetalol  100 mg Oral BID  . multivitamin  1 tablet Oral QHS  . NIFEdipine  60 mg Oral QHS  . pantoprazole  40 mg Oral BID AC  acetaminophen **OR** acetaminophen, diphenhydrAMINE, hydrALAZINE, labetalol, ondansetron **OR** ondansetron (ZOFRAN) IV  Assessment/ Plan:  66 y.o. female with long-standing diabetes, left ventricular hypertrophy, severe hypertension and chronic kidney disease. She was Dx with left ICA stenosis in October 2017. A 2-D echo at that time showed severe LVH and stage I diastolic dysfunction. LV EF was 65-70%. Her hemoglobin A1c was 6.1%.  1. End-stage renal disease.  Chronic kidney disease is likely secondary to Diabetes. Other differential Dx includes FSGS, membranous nephropathy. Patient opted for no biopsy due to potential risks.  - HD started 11/22/2016 - Pt seen during dialysis, UF target 1-1.5kg.   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 Anemia of chronic kidney disease  -  Hgb 8.2, continue epogen 4000 units IV with HD.   4. Proteinuria - Continue irbesartan.   5. Hyperkalemia - K down to 4.3.  6. HTN - Blood pressure  143/72, continue irbesartan, labetolol, and nifedipine.    LOS: 9 Jonuel Butterfield 8/30/201811:33 AM  [address] [place], [place] [phone]

## 2016-11-30 NOTE — Progress Notes (Signed)
Discussed with Dr. Cherylann RatelLateef and pt. Has an outpatient stop for HD and can be discharged after HD tomorrow.

## 2016-11-30 NOTE — Progress Notes (Signed)
Pre hd assessment  

## 2016-11-30 NOTE — Progress Notes (Signed)
HD initiated via R Chest HD cath without issue. Patient has no complaints this am.

## 2016-11-30 NOTE — Care Management (Signed)
Per Dimas ChyleAmanda Morris HD liasion outpatient HD arrangements still pending

## 2016-11-30 NOTE — Progress Notes (Signed)
[facility] - [place] [facility]   PATIENT NAME: Brittany LangtonGlenda Bowman    MR#:  [id number]  DATE OF BIRTH:  [birth year]  SUBJECTIVE:   Patient seen after hemodialysis today. Denies any complaints. Patient somewhat frustrated as we do not have a outpatient spot for hemodialysis for her yet.  REVIEW OF SYSTEMS:    Review of Systems  Constitutional: Negative for chills and fever.  HENT: Negative for congestion and tinnitus.   Eyes: Negative for blurred vision and double vision.  Respiratory: Negative for cough, shortness of breath and wheezing.   Cardiovascular: Negative for chest pain, orthopnea and PND.  Gastrointestinal: Negative for abdominal pain, diarrhea, nausea and vomiting.  Genitourinary: Negative for dysuria and hematuria.  Neurological: Negative for dizziness, sensory change and focal weakness.  All other systems reviewed and are negative.   Nutrition: Renal diet Tolerating Diet: yes Tolerating PT: Ambulatory  DRUG ALLERGIES:   Allergies  Allergen Reactions  . Aspirin   . Nsaids     VITALS:  Blood pressure (!) 165/65, pulse 93, temperature 98.3 F (36.8 C), temperature source Oral, resp. rate 16, height 5\' 8"  (1.727 m), weight 55 kg (121 lb 4.1 oz), SpO2 100 %.  PHYSICAL EXAMINATION:   Physical Exam  GENERAL:  66 y.o.-year-old patient lying in bed in no acute distress.  EYES: Pupils equal, round, reactive to light and accommodation. No scleral icterus. Extraocular muscles intact.  HEENT: Head atraumatic, normocephalic. Oropharynx and nasopharynx clear.  NECK:  Supple, no jugular venous distention. No thyroid enlargement, no tenderness.  LUNGS: Normal breath sounds bilaterally, no wheezing, rales, rhonchi. No use of accessory muscles of respiration.  CARDIOVASCULAR: S1, S2 normal. No murmurs, rubs, or gallops.  ABDOMEN: Soft, nontender, nondistended. Bowel sounds present. No organomegaly or mass.  EXTREMITIES: No cyanosis, clubbing or edema  b/l.    NEUROLOGIC: Cranial nerves II through XII are intact. No focal Motor or sensory deficits b/l.   PSYCHIATRIC: The patient is alert and oriented x 3.  SKIN: No obvious rash, lesion, or ulcer.   Right Chest wall Perm-cath in place.   LABORATORY PANEL:   CBC  Recent Labs Lab 11/30/16 0442  WBC 9.0  HGB 8.2*  HCT 24.2*  PLT 239   ------------------------------------------------------------------------------------------------------------------  Chemistries   Recent Labs Lab 11/30/16 0907  NA 137  K 4.3  CL 100*  CO2 27  GLUCOSE 129*  BUN 66*  CREATININE 4.25*  CALCIUM 8.5*   ------------------------------------------------------------------------------------------------------------------  Cardiac Enzymes No results for input(s): TROPONINI in the last 168 hours. ------------------------------------------------------------------------------------------------------------------  RADIOLOGY:  No results found.   ASSESSMENT AND PLAN:   66 year old female with past medical history of diabetes, essential hypertension, anemia of chronic disease secondary to CKD who presented to the hospital due to worsening renal function and progression of her renal failure.  1. Acute on chronic renal failure-patient has progressive CKD stage V and has been started on hemodialysis. -The cause of patient's renal failure is likely secondary to diabetic nephropathy. -Appreciate nephrology input, continue hemodialysis on a Tuesday Thursday Saturday schedule.  - awaiting outpatient spot for HD and Nephrology now looking for a spot in [place].  No complaints.   2. Essential hypertension-continue labetalol, nifedipine, Avapro.  - cont. PRN labetalol and Hydralazine. BP stable.    3. Anemia of chronic disease-secondary to chronic kidney disease. Hemoglobin improved post transfusion and remains stable at 8.8 as of 11/27/16. No acute bleeding.   4. Diabetes type 2 without complication-continue  sliding scale insulin for  now. - BS Stable.   5. Hyperlipidemia-continue atorvastatin.  6. GERD - cont. Protonix.   Possible discharge soon once outpatient dialysis spot arranged.   All the records are reviewed and case discussed with Care Management/Social Worker. Management plans discussed with the patient, family and they are in agreement.  CODE STATUS: Full code  DVT Prophylaxis: Hep. SQ  TOTAL TIME TAKING CARE OF THIS PATIENT: 25 minutes.   POSSIBLE D/C IN 1-2 DAYS, DEPENDING ON CLINICAL CONDITION.   Houston Siren M.D on 11/30/2016 at 2:20 PM  Between 7am to 6pm - Pager - [phone]  After 6pm go to [email or web address] - Social research officer, government  [facility]  Office  [phone]  CC: Primary care physician; Velia Meyer, MD

## 2016-11-30 NOTE — Progress Notes (Signed)
Pre hd 

## 2016-11-30 NOTE — Progress Notes (Signed)
Hd completed without issue. Goal met, patient tolerated well.

## 2016-11-30 NOTE — Progress Notes (Signed)
Post hd assessment unchanged  

## 2016-12-01 LAB — GLUCOSE, CAPILLARY
Glucose-Capillary: 120 mg/dL — ABNORMAL HIGH (ref 65–99)
Glucose-Capillary: 97 mg/dL (ref 65–99)

## 2016-12-01 LAB — PHOSPHORUS: Phosphorus: 3.5 mg/dL (ref 2.5–4.6)

## 2016-12-01 MED ORDER — NIFEDIPINE ER 60 MG PO TB24: 60.0000 mg | ORAL_TABLET | Freq: Every day | ORAL | 0 refills | Status: AC

## 2016-12-01 MED ORDER — LABETALOL HCL 100 MG PO TABS: 100.0000 mg | ORAL_TABLET | Freq: Two times a day (BID) | ORAL | 0 refills | Status: AC

## 2016-12-01 MED ORDER — NEPRO/CARBSTEADY PO LIQD: 237.0000 mL | Freq: Two times a day (BID) | ORAL | 0 refills | Status: AC

## 2016-12-01 MED ORDER — INSULIN DETEMIR 100 UNIT/ML ~~LOC~~ SOLN: 5.0000 [IU] | Freq: Two times a day (BID) | SUBCUTANEOUS | 0 refills | Status: AC

## 2016-12-01 MED ORDER — IRBESARTAN 300 MG PO TABS: 300.0000 mg | ORAL_TABLET | Freq: Every day | ORAL | 0 refills | Status: AC

## 2016-12-01 MED ORDER — RENA-VITE PO TABS: 1.0000 | ORAL_TABLET | Freq: Every day | ORAL | 0 refills | Status: AC

## 2016-12-01 MED ORDER — ONDANSETRON HCL 4 MG PO TABS: 4.0000 mg | ORAL_TABLET | Freq: Four times a day (QID) | ORAL | 0 refills | Status: AC | PRN

## 2016-12-01 MED ORDER — INSULIN ASPART 100 UNIT/ML ~~LOC~~ SOLN: SUBCUTANEOUS | 0 refills | Status: AC

## 2016-12-01 NOTE — Progress Notes (Signed)
HD STARTED  

## 2016-12-01 NOTE — Progress Notes (Signed)
This note also relates to the following rows which could not be included: Pulse Rate - Cannot attach notes to unvalidated device data Resp - Cannot attach notes to unvalidated device data  HD COMPLETED 

## 2016-12-01 NOTE — Care Management (Signed)
Notified Brittany ChyleAmanda Morris HD liaison of discharge.

## 2016-12-01 NOTE — Care Management Important Message (Signed)
Important Message  Patient Details  Name: Brittany Bowman MRN: [id number] Date of Birth: 02-13-1951   Medicare Important Message Given:  Yes    Chapman FitchBOWEN, [name], RN 12/01/2016, 3:08 PM

## 2016-12-01 NOTE — Progress Notes (Signed)
POST DIALYSIS ASSESSEMNT 

## 2016-12-01 NOTE — Progress Notes (Signed)
PRE DIALYSIS ASSESSMENT 

## 2016-12-01 NOTE — Discharge Summary (Signed)
[facility] - [place] [facility]   PATIENT NAME: Brittany LangtonGlenda Bowman    MR#:  [id number]  DATE OF BIRTH:  1950-11-04  DATE OF ADMISSION:  11/21/2016 ADMITTING PHYSICIAN: Houston SirenVivek J Sainani, MD  DATE OF DISCHARGE: 12/01/2016  PRIMARY CARE PHYSICIAN: Velia MeyerHossain, Mohammad D, MD    ADMISSION DIAGNOSIS:  Acute renal failure start HD end stage renal disease  DISCHARGE DIAGNOSIS:  Active Problems:   Acute on chronic renal failure (HCC)   Malnutrition of moderate degree   SECONDARY DIAGNOSIS:   Past Medical History:  Diagnosis Date  . CKD (chronic kidney disease), stage IV (HCC)   . Diabetes (HCC)   . Essential hypertension   . GERD (gastroesophageal reflux disease)   . Hyperlipidemia     HOSPITAL COURSE:   1. Acute on chronic renal failure progressing to end-stage renal disease. Patient started on hemodialysis. Patient will have outpatient slot starting on Tuesday. Patient will be discharged home after dialysis today. 2. Essential hypertension on labetalol, nifedipine and Avapro. 3. Hyperlipidemia unspecified on atorvastatin 4. Type 2 diabetes mellitus on Levemir insulin and sliding scale sugars are high 5. GERD on Protonix 6. Anemia of chronic disease. Patient transfused during hospital course. Hemoglobin upon discharge 8.2. Procrit with dialysis  DISCHARGE CONDITIONS:   Satisfactory  CONSULTS OBTAINED:  Treatment Team:  Mosetta PigeonSingh, Harmeet, MD Schnier, Latina CraverGregory G, MD  DRUG ALLERGIES:   Allergies  Allergen Reactions  . Aspirin   . Nsaids     DISCHARGE MEDICATIONS:   Current Discharge Medication List    START taking these medications   Details  insulin aspart (NOVOLOG) 100 UNIT/ML injection 3 units prior to meals if sugar above 200 Qty: 10 mL, Refills: 0    insulin detemir (LEVEMIR) 100 UNIT/ML injection Inject 0.05 mLs (5 Units total) into the skin 2 (two) times daily. Qty: 10 mL, Refills: 0    irbesartan (AVAPRO) 300 MG tablet Take 1 tablet (300  mg total) by mouth at bedtime. Qty: 30 tablet, Refills: 0    labetalol (NORMODYNE) 100 MG tablet Take 1 tablet (100 mg total) by mouth 2 (two) times daily. Qty: 60 tablet, Refills: 0    multivitamin (RENA-VIT) TABS tablet Take 1 tablet by mouth at bedtime. Qty: 30 tablet, Refills: 0    Nutritional Supplements (FEEDING SUPPLEMENT, NEPRO CARB STEADY,) LIQD Take 237 mLs by mouth 2 (two) times daily between meals. Qty: 60 Can, Refills: 0    ondansetron (ZOFRAN) 4 MG tablet Take 1 tablet (4 mg total) by mouth every 6 (six) hours as needed for nausea. Qty: 20 tablet, Refills: 0      CONTINUE these medications which have CHANGED   Details  NIFEdipine (PROCARDIA-XL/ADALAT CC) 60 MG 24 hr tablet Take 1 tablet (60 mg total) by mouth at bedtime. Qty: 30 tablet, Refills: 0      CONTINUE these medications which have NOT CHANGED   Details  atorvastatin (LIPITOR) 10 MG tablet Take 10 mg by mouth daily.    pantoprazole (PROTONIX) 40 MG tablet Take 40 mg by mouth 2 (two) times daily before a meal.      STOP taking these medications     insulin NPH Human (HUMULIN N,NOVOLIN N) 100 UNIT/ML injection      metoprolol tartrate (LOPRESSOR) 50 MG tablet          DISCHARGE INSTRUCTIONS:   Follow-up PMD one week Follow-up with dialysis on Tuesday Thursday and Saturday  If you experience worsening of your admission symptoms, develop shortness of  breath, life threatening emergency, suicidal or homicidal thoughts you must seek medical attention immediately by calling 911 or calling your MD immediately  if symptoms less severe.  You Must read complete instructions/literature along with all the possible adverse reactions/side effects for all the Medicines you take and that have been prescribed to you. Take any new Medicines after you have completely understood and accept all the possible adverse reactions/side effects.   Please note  You were cared for by a hospitalist during your hospital stay. If  you have any questions about your discharge medications or the care you received while you were in the hospital after you are discharged, you can call the unit and asked to speak with the hospitalist on call if the hospitalist that took care of you is not available. Once you are discharged, your primary care physician will handle any further medical issues. Please note that NO REFILLS for any discharge medications will be authorized once you are discharged, as it is imperative that you return to your primary care physician (or establish a relationship with a primary care physician if you do not have one) for your aftercare needs so that they can reassess your need for medications and monitor your lab values.    Today   CHIEF COMPLAINT:  Acute on chronic renal failure  HISTORY OF PRESENT ILLNESS:  Brittany Bowman  is a 66 y.o. female with a known history of chronic kidney disease presented with worsening kidney function   VITAL SIGNS:  Blood pressure (!) 137/58, pulse 88, temperature 98.7 F (37.1 C), temperature source Oral, resp. rate (!) 31, height 5\' 8"  (1.727 m), weight 55 kg (121 lb 4.1 oz), SpO2 97 %. Respiratory rate 16 when I saw her  PHYSICAL EXAMINATION:  GENERAL:  66 y.o.-year-old patient lying in the bed with no acute distress.  EYES: Pupils equal, round, reactive to light and accommodation. No scleral icterus. Extraocular muscles intact.  HEENT: Head atraumatic, normocephalic. Oropharynx and nasopharynx clear.  NECK:  Supple, no jugular venous distention. No thyroid enlargement, no tenderness.  LUNGS: Normal breath sounds bilaterally, no wheezing, rales,rhonchi or crepitation. No use of accessory muscles of respiration.  CARDIOVASCULAR: S1, S2 normal. No murmurs, rubs, or gallops.  ABDOMEN: Soft, non-tender, non-distended. Bowel sounds present. No organomegaly or mass.  EXTREMITIES: No pedal edema, cyanosis, or clubbing.  NEUROLOGIC: Cranial nerves II through XII are intact.  Muscle strength 5/5 in all extremities. Sensation intact. Gait not checked.  PSYCHIATRIC: The patient is alert and oriented x 3.  SKIN: No obvious rash, lesion, or ulcer.   DATA REVIEW:   CBC  Recent Labs Lab 11/30/16 0442  WBC 9.0  HGB 8.2*  HCT 24.2*  PLT 239    Chemistries   Recent Labs Lab 11/30/16 0907  NA 137  K 4.3  CL 100*  CO2 27  GLUCOSE 129*  BUN 66*  CREATININE 4.25*  CALCIUM 8.5*       Management plans discussed with the patient, family and they are in agreement.  CODE STATUS:     Code Status Orders        Start     Ordered   11/21/16 1446  Full code  Continuous     11/21/16 1446    Code Status History    Date Active Date Inactive Code Status Order ID Comments User Context   This patient has a current code status but no historical code status.      TOTAL TIME TAKING CARE OF  THIS PATIENT: 35 minutes.    Alford Highland M.D on 12/01/2016 at 3:40 PM  Between 7am to 6pm - Pager - [phone]  After 6pm go to [email or web address] - password [email or web address]  [facility] Office  [phone]  CC: Primary care physician; Velia Meyer, MD

## 2016-12-01 NOTE — Progress Notes (Signed)
[facility] [place], [place] 12/01/16  Subjective:  Patient seen at bedside. Appears to be in good spirits. Due for dialysis today as her next outpatient dialysis will be on Tuesday.  Objective:  Vital signs in last 24 hours:  Temp:  [98.3 F (36.8 C)-98.7 F (37.1 C)] 98.6 F (37 C) (08/31 1030) Pulse Rate:  [83-93] 85 (08/31 1200) Resp:  [11-18] 11 (08/31 1200) BP: (122-165)/(53-68) 149/68 (08/31 1200) SpO2:  [99 %-100 %] 99 % (08/31 1100)  Weight change:  Filed Weights   11/28/16 1253 11/30/16 0855 11/30/16 1157  Weight: 55.2 kg (121 lb 11.1 oz) 56 kg (123 lb 7.3 oz) 55 kg (121 lb 4.1 oz)    Intake/Output:    Intake/Output Summary (Last 24 hours) at 12/01/16 1210 Last data filed at 12/01/16 [id number]  Gross per 24 hour  Intake              727 ml  Output              400 ml  Net              327 ml     Physical Exam: General: Thin, no acute distress   HEENT Anicteric, moist oral mucous membranes   Neck Supple   Pulm/lungs Lungs are clear to auscultation   CVS/Heart Regular rhythm, no rub or gallop   Abdomen:  Soft, nontender   Extremities: No edema   Neurologic: Alert, oriented, Follows commands   Skin: No acute rashes   Access: Rt IJ PC 8/22. Dr Gilda CreaseSchnier       Basic Metabolic Panel:   Recent Labs Lab 11/28/16 0424 11/30/16 0907 12/01/16 1030  NA 135 137  --   K 5.2* 4.3  --   CL 97* 100*  --   CO2 29 27  --   GLUCOSE 161* 129*  --   BUN 68* 66*  --   CREATININE 4.69* 4.25*  --   CALCIUM 8.5* 8.5*  --   PHOS 4.9* 5.0* 3.5     CBC:  Recent Labs Lab 11/25/16 0443 11/26/16 0500 11/27/16 0453 11/30/16 0442  WBC 8.1 9.5  --  9.0  HGB 6.9* 8.7* 8.8* 8.2*  HCT 20.1* 24.9*  --  24.2*  MCV 93.0 89.4  --  93.0  PLT 220 212  --  239      Lab Results  Component Value Date   HEPBSAG Negative 11/23/2016   HEPBSAB Non Reactive 11/23/2016      Microbiology:  No results found for this or any previous visit (from the past  240 hour(s)).  Coagulation Studies: No results for input(s): LABPROT, INR in the last 72 hours.  Urinalysis: No results for input(s): COLORURINE, LABSPEC, PHURINE, GLUCOSEU, HGBUR, BILIRUBINUR, KETONESUR, PROTEINUR, UROBILINOGEN, NITRITE, LEUKOCYTESUR in the last 72 hours.  Invalid input(s): APPERANCEUR    Imaging: No results found.   Medications:   . sodium chloride Stopped (11/27/16 0757)   . atorvastatin  10 mg Oral Daily  . epoetin (EPOGEN/PROCRIT) injection  4,000 Units Intravenous Q T,Th,Sa-HD  . feeding supplement (NEPRO CARB STEADY)  237 mL Oral BID BM  . heparin  5,000 Units Subcutaneous Q8H  . insulin aspart  0-5 Units Subcutaneous QHS  . insulin aspart  0-9 Units Subcutaneous TID WC  . insulin detemir  5 Units Subcutaneous BID  . irbesartan  300 mg Oral QHS  . labetalol  100 mg Oral BID  . multivitamin  1 tablet Oral QHS  .  NIFEdipine  60 mg Oral QHS  . pantoprazole  40 mg Oral BID AC   acetaminophen **OR** acetaminophen, diphenhydrAMINE, hydrALAZINE, labetalol, ondansetron **OR** ondansetron (ZOFRAN) IV  Assessment/ Plan:  66 y.o. female with long-standing diabetes, left ventricular hypertrophy, severe hypertension and chronic kidney disease. She was Dx with left ICA stenosis in October 2017. A 2-D echo at that time showed severe LVH and stage I diastolic dysfunction. LV EF was 65-70%. Her hemoglobin A1c was 6.1%.  1. End-stage renal disease.  Chronic kidney disease is likely secondary to Diabetes. Other differential Dx includes FSGS, membranous nephropathy. Patient opted for no biopsy due to potential risks.  - HD started 11/22/2016 - Patient due for hemodialysis today. Outpatient dialysis has been secured for her in [place].  Her next outpatient dialysis will be on Tuesday.  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 Anemia of chronic kidney disease  -  Patient will need to continue with erythropoietin stimulate  agents as an outpatient.  4. Proteinuria - Continue irbesartan.   5. Hyperkalemia - Resolved for now. Most recent serum potassium was 4.3.  6. HTN - Blood pressure currently 149/68 but has been as low as 136/55 over the preceding 24 hours. Continue irbesartan, labetolol, and nifedipine.    LOS: 10 Mady Haagensen 8/31/201812:10 PM  [address] [place], [place] [phone]

## 2016-12-05 DIAGNOSIS — N2581 Secondary hyperparathyroidism of renal origin: Secondary | ICD-10-CM | POA: Diagnosis not present

## 2016-12-05 DIAGNOSIS — D509 Iron deficiency anemia, unspecified: Secondary | ICD-10-CM | POA: Diagnosis not present

## 2016-12-05 DIAGNOSIS — N186 End stage renal disease: Secondary | ICD-10-CM | POA: Diagnosis not present

## 2016-12-05 DIAGNOSIS — D689 Coagulation defect, unspecified: Secondary | ICD-10-CM | POA: Diagnosis not present

## 2016-12-05 DIAGNOSIS — D631 Anemia in chronic kidney disease: Secondary | ICD-10-CM | POA: Diagnosis not present

## 2016-12-09 DIAGNOSIS — E119 Type 2 diabetes mellitus without complications: Secondary | ICD-10-CM | POA: Diagnosis not present

## 2016-12-26 DIAGNOSIS — D631 Anemia in chronic kidney disease: Secondary | ICD-10-CM | POA: Diagnosis not present

## 2016-12-26 DIAGNOSIS — D509 Iron deficiency anemia, unspecified: Secondary | ICD-10-CM | POA: Diagnosis not present

## 2016-12-26 DIAGNOSIS — N186 End stage renal disease: Secondary | ICD-10-CM | POA: Diagnosis not present

## 2016-12-26 DIAGNOSIS — N2581 Secondary hyperparathyroidism of renal origin: Secondary | ICD-10-CM | POA: Diagnosis not present

## 2016-12-26 DIAGNOSIS — D689 Coagulation defect, unspecified: Secondary | ICD-10-CM | POA: Diagnosis not present

## 2016-12-31 DIAGNOSIS — N186 End stage renal disease: Secondary | ICD-10-CM | POA: Diagnosis not present

## 2016-12-31 DIAGNOSIS — Z992 Dependence on renal dialysis: Secondary | ICD-10-CM | POA: Diagnosis not present

## 2016-12-31 DIAGNOSIS — E1122 Type 2 diabetes mellitus with diabetic chronic kidney disease: Secondary | ICD-10-CM | POA: Diagnosis not present

## 2017-01-02 DIAGNOSIS — D631 Anemia in chronic kidney disease: Secondary | ICD-10-CM | POA: Diagnosis not present

## 2017-01-02 DIAGNOSIS — D509 Iron deficiency anemia, unspecified: Secondary | ICD-10-CM | POA: Diagnosis not present

## 2017-01-02 DIAGNOSIS — D689 Coagulation defect, unspecified: Secondary | ICD-10-CM | POA: Diagnosis not present

## 2017-01-02 DIAGNOSIS — N2581 Secondary hyperparathyroidism of renal origin: Secondary | ICD-10-CM | POA: Diagnosis not present

## 2017-01-02 DIAGNOSIS — N186 End stage renal disease: Secondary | ICD-10-CM | POA: Diagnosis not present

## 2017-01-31 DIAGNOSIS — E1122 Type 2 diabetes mellitus with diabetic chronic kidney disease: Secondary | ICD-10-CM | POA: Diagnosis not present

## 2017-01-31 DIAGNOSIS — N186 End stage renal disease: Secondary | ICD-10-CM | POA: Diagnosis not present

## 2017-01-31 DIAGNOSIS — Z992 Dependence on renal dialysis: Secondary | ICD-10-CM | POA: Diagnosis not present

## 2017-02-03 DIAGNOSIS — N186 End stage renal disease: Secondary | ICD-10-CM | POA: Diagnosis not present

## 2017-02-03 DIAGNOSIS — D509 Iron deficiency anemia, unspecified: Secondary | ICD-10-CM | POA: Diagnosis not present

## 2017-02-03 DIAGNOSIS — D689 Coagulation defect, unspecified: Secondary | ICD-10-CM | POA: Diagnosis not present

## 2017-02-03 DIAGNOSIS — D631 Anemia in chronic kidney disease: Secondary | ICD-10-CM | POA: Diagnosis not present

## 2017-02-03 DIAGNOSIS — N2581 Secondary hyperparathyroidism of renal origin: Secondary | ICD-10-CM | POA: Diagnosis not present

## 2017-02-24 DIAGNOSIS — N2581 Secondary hyperparathyroidism of renal origin: Secondary | ICD-10-CM | POA: Diagnosis not present

## 2017-02-24 DIAGNOSIS — D631 Anemia in chronic kidney disease: Secondary | ICD-10-CM | POA: Diagnosis not present

## 2017-02-24 DIAGNOSIS — D689 Coagulation defect, unspecified: Secondary | ICD-10-CM | POA: Diagnosis not present

## 2017-02-24 DIAGNOSIS — N186 End stage renal disease: Secondary | ICD-10-CM | POA: Diagnosis not present

## 2017-02-24 DIAGNOSIS — D509 Iron deficiency anemia, unspecified: Secondary | ICD-10-CM | POA: Diagnosis not present

## 2017-03-02 DIAGNOSIS — Z992 Dependence on renal dialysis: Secondary | ICD-10-CM | POA: Diagnosis not present

## 2017-03-02 DIAGNOSIS — N186 End stage renal disease: Secondary | ICD-10-CM | POA: Diagnosis not present

## 2017-03-02 DIAGNOSIS — E1122 Type 2 diabetes mellitus with diabetic chronic kidney disease: Secondary | ICD-10-CM | POA: Diagnosis not present

## 2017-03-03 DIAGNOSIS — N186 End stage renal disease: Secondary | ICD-10-CM | POA: Diagnosis not present

## 2017-03-03 DIAGNOSIS — D631 Anemia in chronic kidney disease: Secondary | ICD-10-CM | POA: Diagnosis not present

## 2017-03-03 DIAGNOSIS — N2581 Secondary hyperparathyroidism of renal origin: Secondary | ICD-10-CM | POA: Diagnosis not present

## 2017-03-03 DIAGNOSIS — D509 Iron deficiency anemia, unspecified: Secondary | ICD-10-CM | POA: Diagnosis not present

## 2017-03-03 DIAGNOSIS — D689 Coagulation defect, unspecified: Secondary | ICD-10-CM | POA: Diagnosis not present

## 2017-03-10 DIAGNOSIS — E119 Type 2 diabetes mellitus without complications: Secondary | ICD-10-CM | POA: Diagnosis not present

## 2017-04-02 DIAGNOSIS — Z992 Dependence on renal dialysis: Secondary | ICD-10-CM | POA: Diagnosis not present

## 2017-04-02 DIAGNOSIS — N186 End stage renal disease: Secondary | ICD-10-CM | POA: Diagnosis not present

## 2017-04-02 DIAGNOSIS — E1122 Type 2 diabetes mellitus with diabetic chronic kidney disease: Secondary | ICD-10-CM | POA: Diagnosis not present
